# Patient Record
Sex: Female | Born: 1980 | Race: Black or African American | Hispanic: No | Marital: Married | Smoking: Never smoker
Health system: Southern US, Community
[De-identification: ages and names within clinical notes are randomized; demographics above are authoritative.]

## PROBLEM LIST (undated history)

## (undated) ENCOUNTER — Inpatient Hospital Stay (HOSPITAL_COMMUNITY): Payer: Self-pay

## (undated) DIAGNOSIS — I10 Essential (primary) hypertension: Secondary | ICD-10-CM

## (undated) DIAGNOSIS — O039 Complete or unspecified spontaneous abortion without complication: Secondary | ICD-10-CM

---
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
## 1998-06-05 ENCOUNTER — Emergency Department (HOSPITAL_COMMUNITY): Admission: EM | Admit: 1998-06-05 | Discharge: 1998-06-05 | Payer: Self-pay | Admitting: Emergency Medicine

## 1998-06-07 ENCOUNTER — Ambulatory Visit (HOSPITAL_COMMUNITY): Admission: RE | Admit: 1998-06-07 | Discharge: 1998-06-07 | Payer: Self-pay | Admitting: Emergency Medicine

## 1998-12-27 ENCOUNTER — Inpatient Hospital Stay (HOSPITAL_COMMUNITY): Admission: AD | Admit: 1998-12-27 | Discharge: 1998-12-27 | Payer: Self-pay | Admitting: *Deleted

## 1999-07-10 ENCOUNTER — Encounter: Payer: Self-pay | Admitting: *Deleted

## 1999-07-10 ENCOUNTER — Ambulatory Visit (HOSPITAL_COMMUNITY): Admission: AD | Admit: 1999-07-10 | Discharge: 1999-07-10 | Payer: Self-pay | Admitting: *Deleted

## 1999-07-10 ENCOUNTER — Encounter (INDEPENDENT_AMBULATORY_CARE_PROVIDER_SITE_OTHER): Payer: Self-pay

## 1999-07-12 ENCOUNTER — Inpatient Hospital Stay (HOSPITAL_COMMUNITY): Admission: AD | Admit: 1999-07-12 | Discharge: 1999-07-12 | Payer: Self-pay | Admitting: *Deleted

## 1999-07-15 ENCOUNTER — Inpatient Hospital Stay (HOSPITAL_COMMUNITY): Admission: AD | Admit: 1999-07-15 | Discharge: 1999-07-15 | Payer: Self-pay | Admitting: *Deleted

## 1999-07-16 ENCOUNTER — Encounter (INDEPENDENT_AMBULATORY_CARE_PROVIDER_SITE_OTHER): Payer: Self-pay

## 1999-07-16 ENCOUNTER — Ambulatory Visit (HOSPITAL_COMMUNITY): Admission: AD | Admit: 1999-07-16 | Discharge: 1999-07-16 | Payer: Self-pay | Admitting: Obstetrics and Gynecology

## 1999-10-08 ENCOUNTER — Ambulatory Visit (HOSPITAL_COMMUNITY): Admission: RE | Admit: 1999-10-08 | Discharge: 1999-10-08 | Payer: Self-pay | Admitting: Obstetrics and Gynecology

## 1999-10-08 ENCOUNTER — Encounter: Payer: Self-pay | Admitting: Obstetrics and Gynecology

## 1999-12-30 ENCOUNTER — Other Ambulatory Visit: Admission: RE | Admit: 1999-12-30 | Discharge: 1999-12-30 | Payer: Self-pay | Admitting: Obstetrics and Gynecology

## 2000-10-11 ENCOUNTER — Other Ambulatory Visit: Admission: RE | Admit: 2000-10-11 | Discharge: 2000-10-11 | Payer: Self-pay | Admitting: Obstetrics and Gynecology

## 2000-10-19 ENCOUNTER — Encounter (INDEPENDENT_AMBULATORY_CARE_PROVIDER_SITE_OTHER): Payer: Self-pay

## 2000-10-19 ENCOUNTER — Ambulatory Visit (HOSPITAL_COMMUNITY): Admission: RE | Admit: 2000-10-19 | Discharge: 2000-10-19 | Payer: Self-pay | Admitting: Obstetrics and Gynecology

## 2001-06-22 ENCOUNTER — Encounter: Payer: Self-pay | Admitting: Obstetrics and Gynecology

## 2001-06-22 ENCOUNTER — Other Ambulatory Visit: Admission: RE | Admit: 2001-06-22 | Discharge: 2001-06-22 | Payer: Self-pay | Admitting: Obstetrics and Gynecology

## 2001-06-22 ENCOUNTER — Ambulatory Visit (HOSPITAL_COMMUNITY): Admission: RE | Admit: 2001-06-22 | Discharge: 2001-06-22 | Payer: Self-pay | Admitting: Obstetrics and Gynecology

## 2001-06-26 ENCOUNTER — Encounter (INDEPENDENT_AMBULATORY_CARE_PROVIDER_SITE_OTHER): Payer: Self-pay | Admitting: Specialist

## 2001-06-26 ENCOUNTER — Ambulatory Visit (HOSPITAL_COMMUNITY): Admission: RE | Admit: 2001-06-26 | Discharge: 2001-06-26 | Payer: Self-pay | Admitting: Obstetrics and Gynecology

## 2001-06-28 ENCOUNTER — Encounter: Payer: Self-pay | Admitting: Obstetrics & Gynecology

## 2001-06-28 ENCOUNTER — Inpatient Hospital Stay (HOSPITAL_COMMUNITY): Admission: AD | Admit: 2001-06-28 | Discharge: 2001-06-28 | Payer: Self-pay | Admitting: Obstetrics & Gynecology

## 2002-07-18 ENCOUNTER — Encounter: Admission: RE | Admit: 2002-07-18 | Discharge: 2002-07-18 | Payer: Self-pay | Admitting: *Deleted

## 2002-07-25 ENCOUNTER — Ambulatory Visit (HOSPITAL_COMMUNITY): Admission: RE | Admit: 2002-07-25 | Discharge: 2002-07-25 | Payer: Self-pay | Admitting: *Deleted

## 2002-08-01 ENCOUNTER — Encounter: Admission: RE | Admit: 2002-08-01 | Discharge: 2002-08-01 | Payer: Self-pay | Admitting: *Deleted

## 2002-08-05 ENCOUNTER — Inpatient Hospital Stay (HOSPITAL_COMMUNITY): Admission: AD | Admit: 2002-08-05 | Discharge: 2002-08-05 | Payer: Self-pay | Admitting: *Deleted

## 2002-08-06 ENCOUNTER — Inpatient Hospital Stay (HOSPITAL_COMMUNITY): Admission: AD | Admit: 2002-08-06 | Discharge: 2002-08-06 | Payer: Self-pay | Admitting: Obstetrics and Gynecology

## 2002-08-09 ENCOUNTER — Encounter: Payer: Self-pay | Admitting: *Deleted

## 2002-08-09 ENCOUNTER — Ambulatory Visit (HOSPITAL_COMMUNITY): Admission: RE | Admit: 2002-08-09 | Discharge: 2002-08-09 | Payer: Self-pay | Admitting: *Deleted

## 2002-08-22 ENCOUNTER — Encounter: Admission: RE | Admit: 2002-08-22 | Discharge: 2002-08-22 | Payer: Self-pay | Admitting: *Deleted

## 2002-09-05 ENCOUNTER — Encounter: Admission: RE | Admit: 2002-09-05 | Discharge: 2002-09-05 | Payer: Self-pay | Admitting: *Deleted

## 2002-09-26 ENCOUNTER — Encounter: Payer: Self-pay | Admitting: *Deleted

## 2002-09-26 ENCOUNTER — Encounter: Admission: RE | Admit: 2002-09-26 | Discharge: 2002-09-26 | Payer: Self-pay | Admitting: *Deleted

## 2002-09-26 ENCOUNTER — Ambulatory Visit (HOSPITAL_COMMUNITY): Admission: RE | Admit: 2002-09-26 | Discharge: 2002-09-26 | Payer: Self-pay | Admitting: *Deleted

## 2002-10-07 ENCOUNTER — Inpatient Hospital Stay (HOSPITAL_COMMUNITY): Admission: AD | Admit: 2002-10-07 | Discharge: 2002-10-07 | Payer: Self-pay | Admitting: Family Medicine

## 2002-10-10 ENCOUNTER — Encounter: Admission: RE | Admit: 2002-10-10 | Discharge: 2002-10-10 | Payer: Self-pay | Admitting: Family Medicine

## 2002-10-24 ENCOUNTER — Encounter: Admission: RE | Admit: 2002-10-24 | Discharge: 2002-10-24 | Payer: Self-pay | Admitting: Family Medicine

## 2002-11-07 ENCOUNTER — Encounter: Admission: RE | Admit: 2002-11-07 | Discharge: 2002-11-07 | Payer: Self-pay | Admitting: Family Medicine

## 2002-11-11 ENCOUNTER — Encounter: Admission: RE | Admit: 2002-11-11 | Discharge: 2002-11-11 | Payer: Self-pay | Admitting: *Deleted

## 2002-11-11 ENCOUNTER — Encounter: Payer: Self-pay | Admitting: Obstetrics and Gynecology

## 2002-11-11 ENCOUNTER — Inpatient Hospital Stay (HOSPITAL_COMMUNITY): Admission: RE | Admit: 2002-11-11 | Discharge: 2002-11-11 | Payer: Self-pay | Admitting: *Deleted

## 2002-11-14 ENCOUNTER — Encounter: Admission: RE | Admit: 2002-11-14 | Discharge: 2002-11-14 | Payer: Self-pay | Admitting: Family Medicine

## 2002-11-18 ENCOUNTER — Encounter: Admission: RE | Admit: 2002-11-18 | Discharge: 2002-11-18 | Payer: Self-pay | Admitting: *Deleted

## 2002-11-21 ENCOUNTER — Encounter: Admission: RE | Admit: 2002-11-21 | Discharge: 2002-11-21 | Payer: Self-pay | Admitting: Family Medicine

## 2002-11-25 ENCOUNTER — Encounter: Admission: RE | Admit: 2002-11-25 | Discharge: 2002-11-25 | Payer: Self-pay | Admitting: *Deleted

## 2002-11-28 ENCOUNTER — Encounter: Admission: RE | Admit: 2002-11-28 | Discharge: 2002-11-28 | Payer: Self-pay | Admitting: *Deleted

## 2002-12-02 ENCOUNTER — Encounter: Admission: RE | Admit: 2002-12-02 | Discharge: 2002-12-02 | Payer: Self-pay | Admitting: *Deleted

## 2002-12-05 ENCOUNTER — Encounter: Admission: RE | Admit: 2002-12-05 | Discharge: 2002-12-05 | Payer: Self-pay | Admitting: *Deleted

## 2002-12-09 ENCOUNTER — Encounter: Admission: RE | Admit: 2002-12-09 | Discharge: 2002-12-09 | Payer: Self-pay | Admitting: *Deleted

## 2002-12-12 ENCOUNTER — Encounter: Admission: RE | Admit: 2002-12-12 | Discharge: 2002-12-12 | Payer: Self-pay | Admitting: *Deleted

## 2002-12-13 ENCOUNTER — Encounter (INDEPENDENT_AMBULATORY_CARE_PROVIDER_SITE_OTHER): Payer: Self-pay | Admitting: *Deleted

## 2002-12-13 ENCOUNTER — Inpatient Hospital Stay (HOSPITAL_COMMUNITY): Admission: RE | Admit: 2002-12-13 | Discharge: 2002-12-16 | Payer: Self-pay | Admitting: Obstetrics & Gynecology

## 2002-12-13 DIAGNOSIS — O321XX0 Maternal care for breech presentation, not applicable or unspecified: Secondary | ICD-10-CM

## 2002-12-18 ENCOUNTER — Inpatient Hospital Stay (HOSPITAL_COMMUNITY): Admission: AD | Admit: 2002-12-18 | Discharge: 2002-12-18 | Payer: Self-pay | Admitting: Obstetrics and Gynecology

## 2004-10-14 ENCOUNTER — Ambulatory Visit (HOSPITAL_COMMUNITY): Admission: AD | Admit: 2004-10-14 | Discharge: 2004-10-14 | Payer: Self-pay | Admitting: Obstetrics & Gynecology

## 2004-10-14 ENCOUNTER — Ambulatory Visit: Payer: Self-pay | Admitting: Obstetrics & Gynecology

## 2004-10-14 ENCOUNTER — Encounter (INDEPENDENT_AMBULATORY_CARE_PROVIDER_SITE_OTHER): Payer: Self-pay | Admitting: *Deleted

## 2004-10-28 ENCOUNTER — Ambulatory Visit: Payer: Self-pay | Admitting: Obstetrics and Gynecology

## 2004-12-23 ENCOUNTER — Ambulatory Visit: Payer: Self-pay | Admitting: Obstetrics and Gynecology

## 2005-03-20 ENCOUNTER — Inpatient Hospital Stay (HOSPITAL_COMMUNITY): Admission: AD | Admit: 2005-03-20 | Discharge: 2005-03-20 | Payer: Self-pay | Admitting: Family Medicine

## 2005-04-01 ENCOUNTER — Inpatient Hospital Stay (HOSPITAL_COMMUNITY): Admission: AD | Admit: 2005-04-01 | Discharge: 2005-04-01 | Payer: Self-pay | Admitting: Obstetrics & Gynecology

## 2005-04-28 ENCOUNTER — Other Ambulatory Visit: Admission: RE | Admit: 2005-04-28 | Discharge: 2005-04-28 | Payer: Self-pay | Admitting: Obstetrics and Gynecology

## 2005-05-20 ENCOUNTER — Inpatient Hospital Stay (HOSPITAL_COMMUNITY): Admission: AD | Admit: 2005-05-20 | Discharge: 2005-05-20 | Payer: Self-pay | Admitting: Obstetrics and Gynecology

## 2005-06-16 ENCOUNTER — Ambulatory Visit: Payer: Self-pay | Admitting: Obstetrics & Gynecology

## 2005-06-16 ENCOUNTER — Inpatient Hospital Stay (HOSPITAL_COMMUNITY): Admission: AD | Admit: 2005-06-16 | Discharge: 2005-06-18 | Payer: Self-pay | Admitting: Obstetrics and Gynecology

## 2005-08-16 ENCOUNTER — Inpatient Hospital Stay (HOSPITAL_COMMUNITY): Admission: AD | Admit: 2005-08-16 | Discharge: 2005-08-22 | Payer: Self-pay | Admitting: *Deleted

## 2005-08-16 ENCOUNTER — Ambulatory Visit: Payer: Self-pay | Admitting: *Deleted

## 2005-08-18 ENCOUNTER — Ambulatory Visit: Payer: Self-pay | Admitting: Neonatology

## 2005-08-18 ENCOUNTER — Encounter (INDEPENDENT_AMBULATORY_CARE_PROVIDER_SITE_OTHER): Payer: Self-pay | Admitting: Specialist

## 2005-08-18 DIAGNOSIS — O1413 Severe pre-eclampsia, third trimester: Secondary | ICD-10-CM

## 2005-08-18 DIAGNOSIS — O321XX0 Maternal care for breech presentation, not applicable or unspecified: Secondary | ICD-10-CM

## 2005-09-02 ENCOUNTER — Ambulatory Visit: Payer: Self-pay | Admitting: Gynecology

## 2005-10-19 ENCOUNTER — Encounter: Payer: Self-pay | Admitting: Emergency Medicine

## 2007-09-27 ENCOUNTER — Ambulatory Visit (HOSPITAL_COMMUNITY): Admission: RE | Admit: 2007-09-27 | Discharge: 2007-09-27 | Payer: Self-pay | Admitting: Family Medicine

## 2007-10-04 ENCOUNTER — Ambulatory Visit: Payer: Self-pay | Admitting: Family Medicine

## 2007-10-05 ENCOUNTER — Ambulatory Visit: Payer: Self-pay | Admitting: Gynecology

## 2007-10-08 ENCOUNTER — Ambulatory Visit (HOSPITAL_COMMUNITY): Admission: RE | Admit: 2007-10-08 | Discharge: 2007-10-08 | Payer: Self-pay | Admitting: Family Medicine

## 2007-10-25 ENCOUNTER — Ambulatory Visit: Payer: Self-pay | Admitting: Obstetrics & Gynecology

## 2007-10-25 ENCOUNTER — Inpatient Hospital Stay (HOSPITAL_COMMUNITY): Admission: RE | Admit: 2007-10-25 | Discharge: 2007-10-25 | Payer: Self-pay | Admitting: Obstetrics & Gynecology

## 2007-10-25 ENCOUNTER — Ambulatory Visit: Payer: Self-pay | Admitting: Physician Assistant

## 2007-10-29 ENCOUNTER — Ambulatory Visit (HOSPITAL_COMMUNITY): Admission: RE | Admit: 2007-10-29 | Discharge: 2007-10-29 | Payer: Self-pay | Admitting: Family Medicine

## 2007-10-31 ENCOUNTER — Ambulatory Visit: Payer: Self-pay | Admitting: Obstetrics and Gynecology

## 2007-10-31 ENCOUNTER — Inpatient Hospital Stay (HOSPITAL_COMMUNITY): Admission: RE | Admit: 2007-10-31 | Discharge: 2007-11-02 | Payer: Self-pay | Admitting: Family Medicine

## 2007-10-31 ENCOUNTER — Encounter: Payer: Self-pay | Admitting: Family Medicine

## 2007-11-01 ENCOUNTER — Encounter: Payer: Self-pay | Admitting: Obstetrics and Gynecology

## 2009-09-25 ENCOUNTER — Inpatient Hospital Stay (HOSPITAL_COMMUNITY): Admission: AD | Admit: 2009-09-25 | Discharge: 2009-09-25 | Payer: Self-pay | Admitting: Family Medicine

## 2009-10-01 ENCOUNTER — Ambulatory Visit: Payer: Self-pay | Admitting: Family Medicine

## 2009-10-01 LAB — CONVERTED CEMR LAB
AntiThromb III Func: 97 % (ref 76–126)
Anticardiolipin IgA: 2 (ref <22)
Protein S Ag, Total: 83 % (ref 70–140)

## 2009-10-05 ENCOUNTER — Ambulatory Visit (HOSPITAL_COMMUNITY): Admission: RE | Admit: 2009-10-05 | Discharge: 2009-10-05 | Payer: Self-pay | Admitting: Family Medicine

## 2009-10-26 ENCOUNTER — Ambulatory Visit (HOSPITAL_COMMUNITY): Admission: RE | Admit: 2009-10-26 | Discharge: 2009-10-26 | Payer: Self-pay | Admitting: Family Medicine

## 2009-10-29 ENCOUNTER — Encounter: Payer: Self-pay | Admitting: Family Medicine

## 2009-10-29 ENCOUNTER — Ambulatory Visit: Payer: Self-pay | Admitting: Obstetrics & Gynecology

## 2009-11-05 ENCOUNTER — Ambulatory Visit: Payer: Self-pay | Admitting: Obstetrics & Gynecology

## 2009-11-05 ENCOUNTER — Encounter: Payer: Self-pay | Admitting: Family Medicine

## 2009-11-05 LAB — CONVERTED CEMR LAB
ALT: 27 units/L (ref 0–35)
CO2: 26 meq/L (ref 19–32)
Calcium: 9.5 mg/dL (ref 8.4–10.5)
Chloride: 102 meq/L (ref 96–112)
Creatinine, Ser: 0.69 mg/dL (ref 0.40–1.20)
Glucose, Bld: 106 mg/dL — ABNORMAL HIGH (ref 70–99)
HCT: 34.4 % — ABNORMAL LOW (ref 36.0–46.0)
Hemoglobin: 10.8 g/dL — ABNORMAL LOW (ref 12.0–15.0)
Platelets: 228 10*3/uL (ref 150–400)
RBC: 4.14 M/uL (ref 3.87–5.11)
Uric Acid, Serum: 3.2 mg/dL (ref 2.4–7.0)
WBC: 8.1 10*3/uL (ref 4.0–10.5)

## 2009-11-06 ENCOUNTER — Encounter: Payer: Self-pay | Admitting: Family Medicine

## 2009-11-06 LAB — CONVERTED CEMR LAB: Creatinine 24 HR UR: 1461 mg/24hr (ref 700–1800)

## 2009-11-13 ENCOUNTER — Inpatient Hospital Stay (HOSPITAL_COMMUNITY): Admission: AD | Admit: 2009-11-13 | Discharge: 2009-11-13 | Payer: Self-pay | Admitting: Obstetrics & Gynecology

## 2009-11-23 ENCOUNTER — Ambulatory Visit (HOSPITAL_COMMUNITY): Admission: RE | Admit: 2009-11-23 | Discharge: 2009-11-23 | Payer: Self-pay | Admitting: Obstetrics & Gynecology

## 2009-11-26 ENCOUNTER — Ambulatory Visit: Payer: Self-pay | Admitting: Obstetrics & Gynecology

## 2009-11-27 IMAGING — US US OB COMP LESS 14 WK
1 series · 14 of 25 positions shown · non-contrast
Comparison: none

OBSTETRICAL ULTRASOUND:
 This ultrasound exam was performed in the [HOSPITAL] Ultrasound Department.  The OB US report was generated in the AS system, and faxed to the ordering physician.  This report is also available in [REDACTED] PACS.

[Series 1: us ob comp less 14 wk · 0.28mm/px · 14 of 25 slices shown]
[im 1/25]
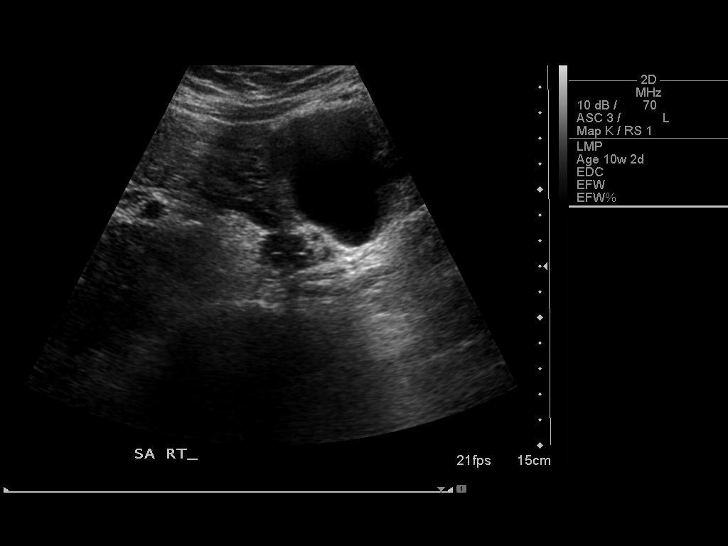
[im 3/25]
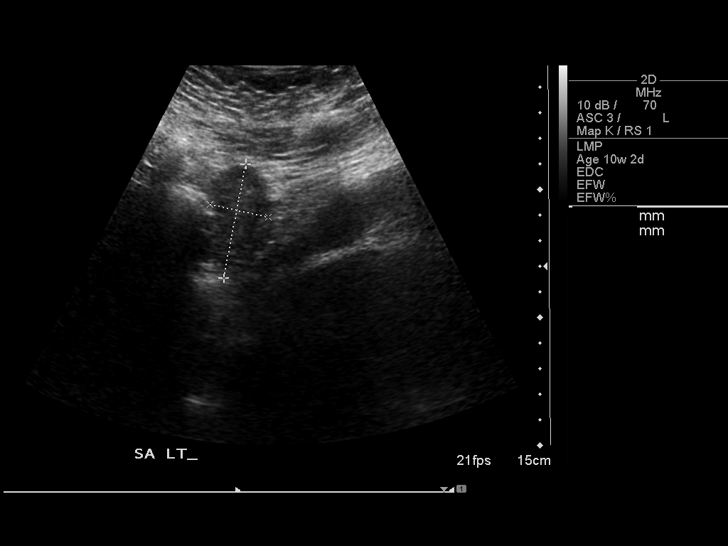
[im 5/25]
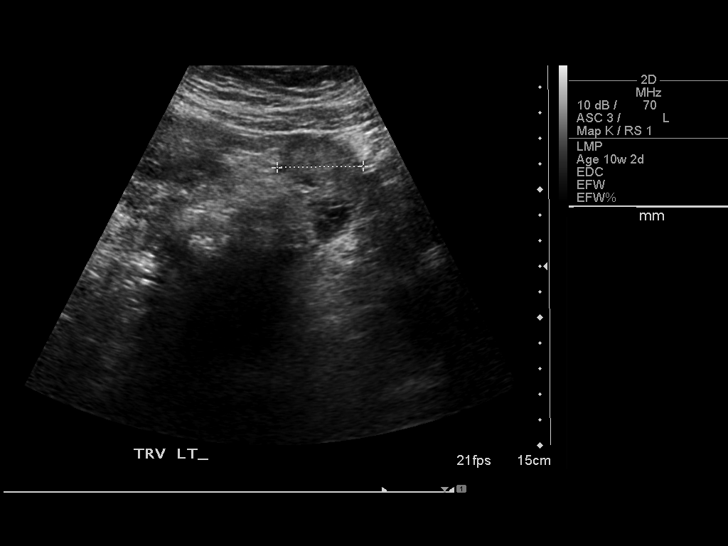
[im 7/25]
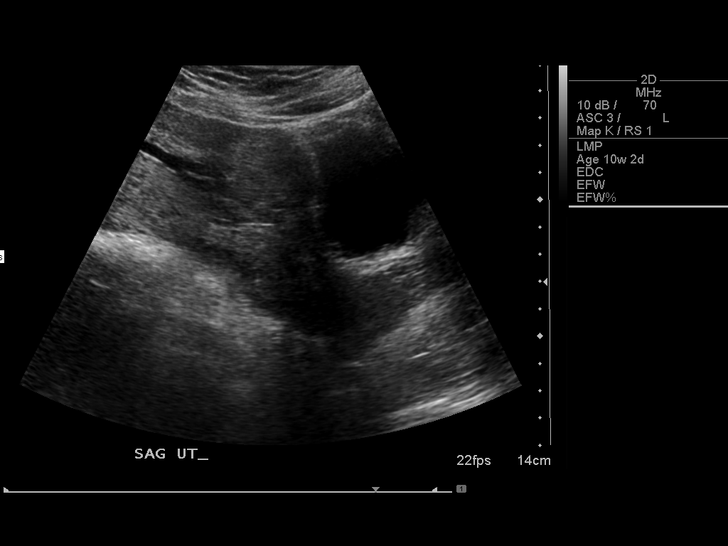
[im 9/25]
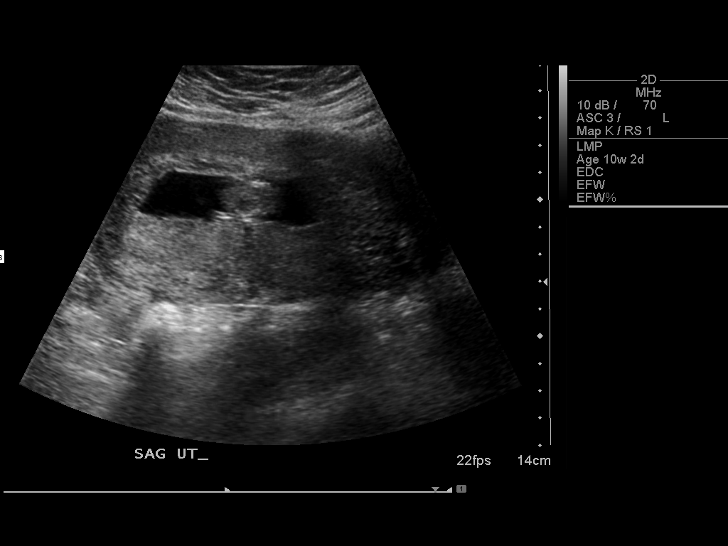
[im 10/25]
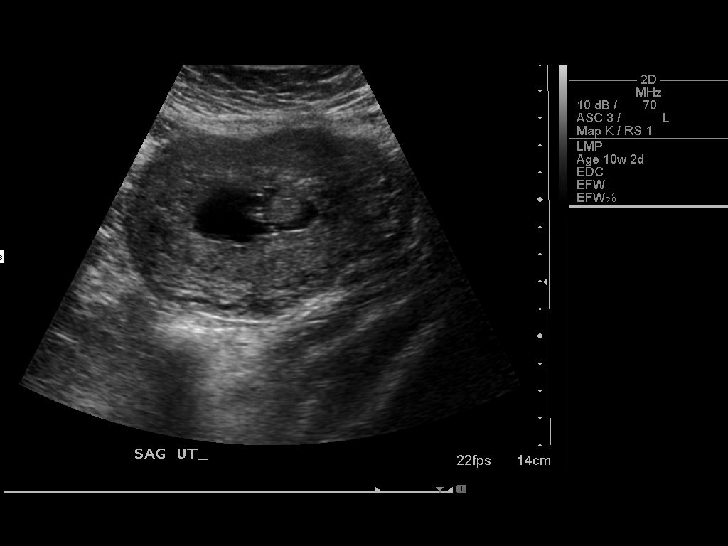
[im 12/25]
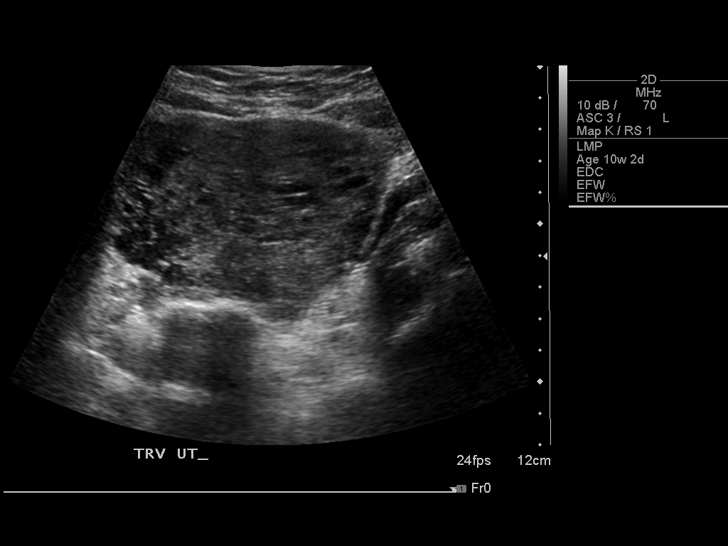
[im 14/25]
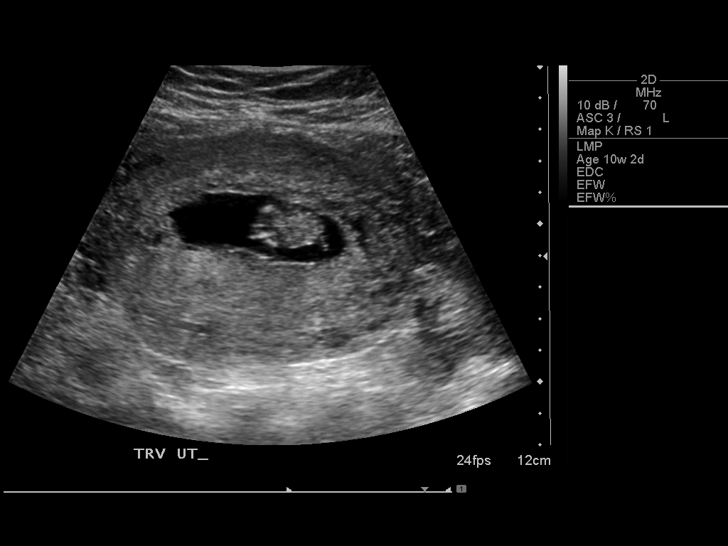
[im 16/25]
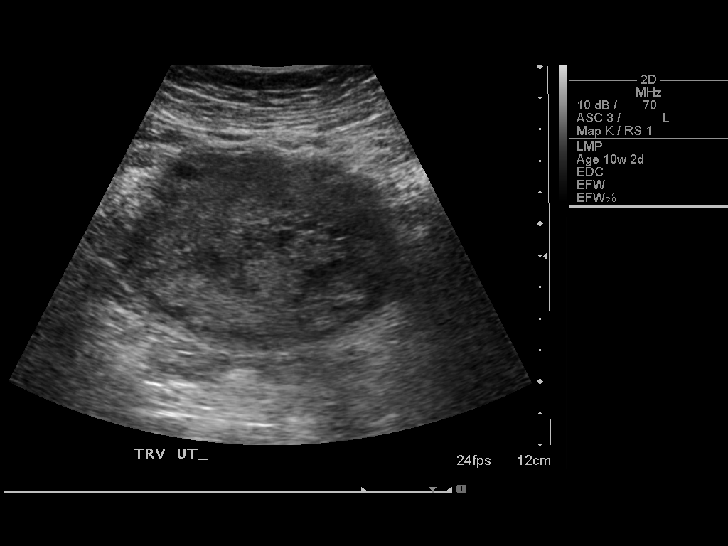
[im 17/25]
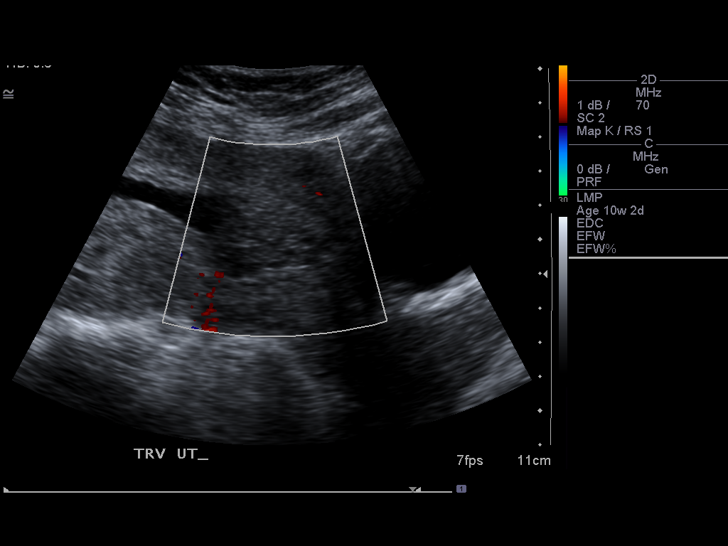
[im 19/25]
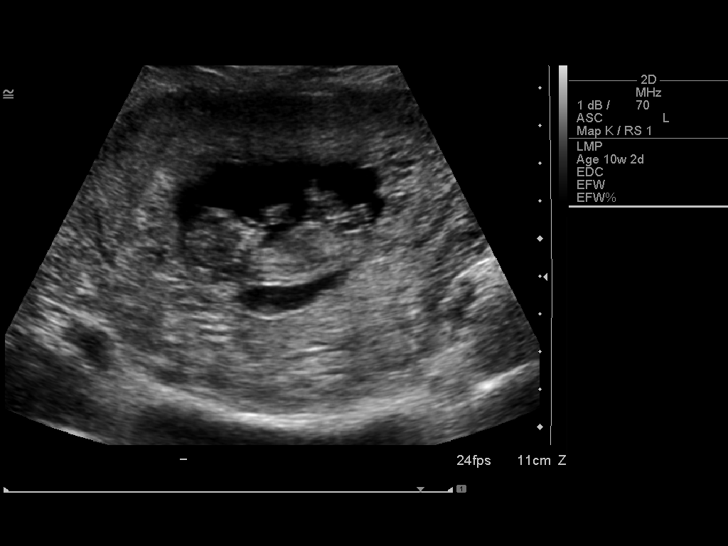
[im 21/25]
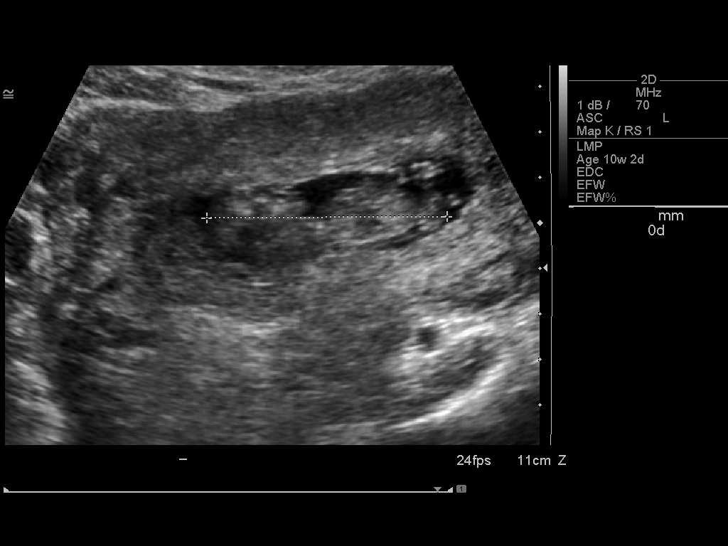
[im 23/25]
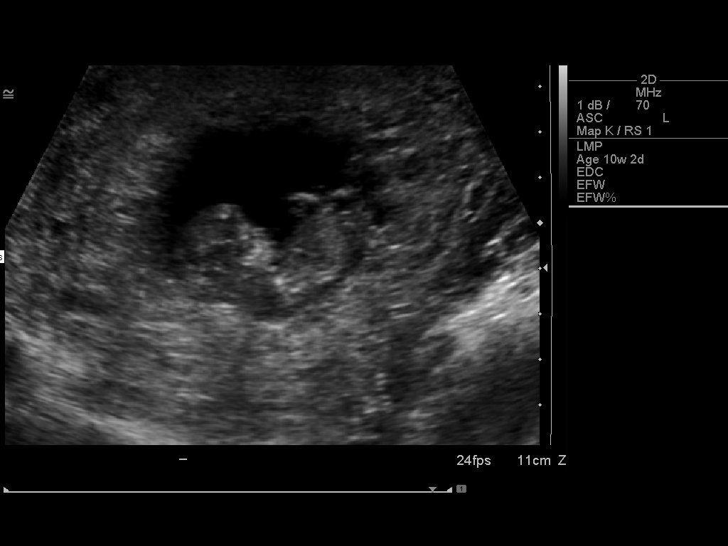
[im 25/25]
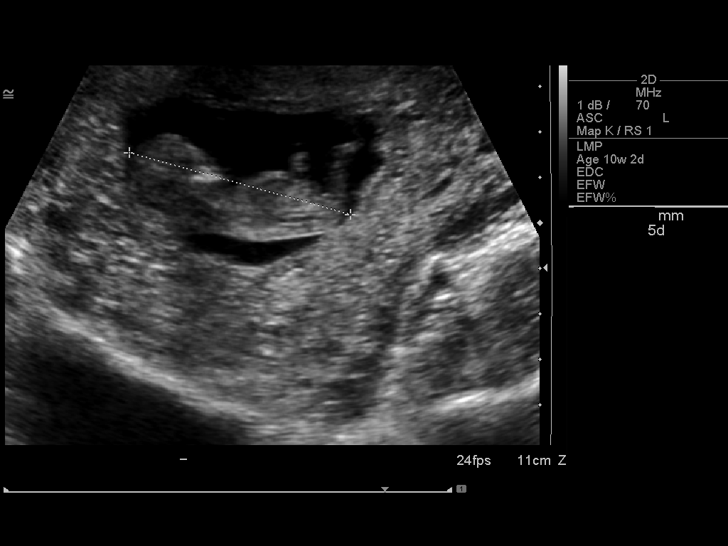

[14 of 25 positions shown; findings below may reference images not displayed]

IMPRESSION: See AS Obstetric US report.

## 2009-12-10 ENCOUNTER — Ambulatory Visit: Payer: Self-pay | Admitting: Obstetrics & Gynecology

## 2009-12-24 ENCOUNTER — Ambulatory Visit: Payer: Self-pay | Admitting: Obstetrics and Gynecology

## 2010-01-07 ENCOUNTER — Ambulatory Visit: Payer: Self-pay | Admitting: Obstetrics & Gynecology

## 2010-01-07 ENCOUNTER — Inpatient Hospital Stay (HOSPITAL_COMMUNITY): Admission: RE | Admit: 2010-01-07 | Discharge: 2010-01-11 | Payer: Self-pay | Admitting: Family Medicine

## 2010-01-07 ENCOUNTER — Ambulatory Visit: Payer: Self-pay | Admitting: Family Medicine

## 2010-01-07 LAB — CONVERTED CEMR LAB
Alkaline Phosphatase: 82 units/L (ref 39–117)
BUN: 4 mg/dL — ABNORMAL LOW (ref 6–23)
Glucose, Bld: 85 mg/dL (ref 70–99)
Hemoglobin: 11.4 g/dL — ABNORMAL LOW (ref 12.0–15.0)
MCHC: 31.5 g/dL (ref 30.0–36.0)
MCV: 85.6 fL (ref 78.0–100.0)
RBC: 4.23 M/uL (ref 3.87–5.11)
Sodium: 137 meq/L (ref 135–145)
Total Bilirubin: 0.3 mg/dL (ref 0.3–1.2)

## 2010-01-08 ENCOUNTER — Encounter: Payer: Self-pay | Admitting: Obstetrics & Gynecology

## 2010-01-14 ENCOUNTER — Ambulatory Visit: Payer: Self-pay | Admitting: Obstetrics and Gynecology

## 2010-01-15 ENCOUNTER — Inpatient Hospital Stay (HOSPITAL_COMMUNITY): Admission: AD | Admit: 2010-01-15 | Discharge: 2010-01-15 | Payer: Self-pay | Admitting: Obstetrics & Gynecology

## 2010-01-15 ENCOUNTER — Encounter: Payer: Self-pay | Admitting: Obstetrics & Gynecology

## 2010-01-15 ENCOUNTER — Ambulatory Visit: Payer: Self-pay | Admitting: Obstetrics and Gynecology

## 2010-02-10 ENCOUNTER — Ambulatory Visit: Payer: Self-pay | Admitting: Obstetrics and Gynecology

## 2010-03-05 ENCOUNTER — Ambulatory Visit: Payer: Self-pay | Admitting: Obstetrics & Gynecology

## 2010-07-11 ENCOUNTER — Encounter: Payer: Self-pay | Admitting: *Deleted

## 2010-07-11 ENCOUNTER — Encounter: Payer: Self-pay | Admitting: Family Medicine

## 2010-09-04 LAB — CBC
HCT: 33.7 % — ABNORMAL LOW (ref 36.0–46.0)
MCHC: 33.3 g/dL (ref 30.0–36.0)
MCV: 83.8 fL (ref 78.0–100.0)
RDW: 14.6 % (ref 11.5–15.5)
WBC: 8 10*3/uL (ref 4.0–10.5)

## 2010-09-04 LAB — COMPREHENSIVE METABOLIC PANEL
ALT: 29 U/L (ref 0–35)
AST: 30 U/L (ref 0–37)
Albumin: 2.9 g/dL — ABNORMAL LOW (ref 3.5–5.2)
CO2: 26 mEq/L (ref 19–32)
Calcium: 9.2 mg/dL (ref 8.4–10.5)
Chloride: 103 mEq/L (ref 96–112)
Creatinine, Ser: 0.51 mg/dL (ref 0.4–1.2)
GFR calc Af Amer: >60 mL/min (ref >60)
Sodium: 134 mEq/L — ABNORMAL LOW (ref 135–145)
Total Bilirubin: 0.3 mg/dL (ref 0.3–1.2)

## 2010-09-04 LAB — CREATININE CLEARANCE, URINE, 24 HOUR
Creatinine Clearance: 249 mL/min — ABNORMAL HIGH (ref 75–115)
Creatinine, Urine: 66.6 mg/dL
Creatinine: 0.51 mg/dL (ref 0.4–1.2)

## 2010-09-04 LAB — POCT URINALYSIS DIP (DEVICE)
Bilirubin Urine: NEGATIVE
Ketones, ur: NEGATIVE mg/dL
Specific Gravity, Urine: 1.02 (ref 1.005–1.030)
Specific Gravity, Urine: 1.025 (ref 1.005–1.030)
pH: 6.5 (ref 5.0–8.0)

## 2010-09-04 LAB — PROTEIN, URINE, 24 HOUR: Urine Total Volume-UPROT: 2750 mL

## 2010-09-04 LAB — STREP B DNA PROBE: Strep Group B Ag: POSITIVE

## 2010-09-05 LAB — POCT URINALYSIS DIP (DEVICE)
Glucose, UA: NEGATIVE mg/dL
Ketones, ur: NEGATIVE mg/dL
Specific Gravity, Urine: 1.025 (ref 1.005–1.030)

## 2010-09-06 LAB — POCT URINALYSIS DIP (DEVICE)
Bilirubin Urine: NEGATIVE
Bilirubin Urine: NEGATIVE
Glucose, UA: NEGATIVE mg/dL
Ketones, ur: NEGATIVE mg/dL
Ketones, ur: NEGATIVE mg/dL
Ketones, ur: NEGATIVE mg/dL
Nitrite: NEGATIVE
Protein, ur: NEGATIVE mg/dL
Specific Gravity, Urine: 1.02 (ref 1.005–1.030)
Specific Gravity, Urine: 1.02 (ref 1.005–1.030)
Specific Gravity, Urine: 1.025 (ref 1.005–1.030)
pH: 7 (ref 5.0–8.0)

## 2010-09-06 LAB — URINALYSIS, ROUTINE W REFLEX MICROSCOPIC
Glucose, UA: NEGATIVE mg/dL
Ketones, ur: NEGATIVE mg/dL
Leukocytes, UA: NEGATIVE
pH: 7 (ref 5.0–8.0)

## 2010-09-06 LAB — URINE MICROSCOPIC-ADD ON

## 2010-09-06 LAB — URINE CULTURE: Colony Count: >=100000

## 2010-09-07 LAB — POCT URINALYSIS DIP (DEVICE)
Bilirubin Urine: NEGATIVE
Ketones, ur: NEGATIVE mg/dL
pH: 6 (ref 5.0–8.0)

## 2010-09-08 LAB — WET PREP, GENITAL
Trich, Wet Prep: NONE SEEN
Yeast Wet Prep HPF POC: NONE SEEN

## 2010-09-08 LAB — POCT URINALYSIS DIP (DEVICE)
Glucose, UA: NEGATIVE mg/dL
Nitrite: NEGATIVE
Urobilinogen, UA: 0.2 mg/dL (ref 0.0–1.0)

## 2010-11-02 NOTE — Op Note (Signed)
NAMEOVIE, EASTEP          ACCOUNT NO.:  [id number]   MEDICAL RECORD NO.:  [id number]          PATIENT TYPE:  INP   LOCATION:                                [facility]ILITY:  [facility]   PHYSICIAN:  Tilda Burrow, M.D. DATE OF BIRTH:  [birth year]   DATE OF PROCEDURE:  10/31/2007  DATE OF DISCHARGE:                               OPERATIVE REPORT   PREOPERATIVE DIAGNOSIS:  Retained placenta, status post spontaneous  abortion, 16 weeks.   POSTOPERATIVE DIAGNOSIS:  Retained placenta, status post spontaneous  abortion, 16 weeks.   PROCEDURE:  Dilation and extraction for retained placenta.   SURGEON:  Tilda Burrow, MD   ASSISTANT:  None.   ANESTHESIA:  General with endotracheal intubation.   COMPLICATIONS:  None.   FINDINGS:  Uterus sounding to 16 cm preprocedure, 11 cm postprocedure.  Generous tissue fragments consistent with placental tissue.  Blood type  A positive.   INDICATIONS:  A 30 year old female who was induced for missed AB at 65  weeks' gestation, who expelled the fetus at 1:30 a.m. this morning, and  was given additional 4 hours of IV oxytocin, unable to expel the fetus,  OR time available at noon, suction and uterine dilation and extraction  performed.   DETAILS OF PROCEDURE:  The patient was taken to the operating room,  prepped and draped in usual fashion for vaginal procedure with high  lithotomy leg support.  Time-out was performed and confirmed.  IV  antibiotics were administered.  The patient had cervix, which can be  grasped, was already open 2-cm, allowed easy introduction of a 35-mm  uterine sound to a depth of approximately 16 cm.  The uterus was probed  with a 4-cm long grasping forceps, identifying large tissue fragments  consistent with placental tissue, which we were able peel off the  uterus.  The placental detachment was on the right posterior portion of  the uterine fundus.  Multiple large fragments of placental tissue were  extracted and then a  banjo curette was used to gently curette the  surfaces.  The anterior surface was smooth and without any remnants and  posteriorly, there was initial tissue present.  Additional extraction  with grasping with the large extraction forceps, removed additional  tissue fragments.  The uterus could be felt to be shrinking in size  throughout this portion of the procedure.  At the end of the second  attempt, smooth/sharp curettage posteriorly, obtained a uniform smooth  feel consistent with adequate evacuation.  The procedure was considered  completed.  Bimanual exam showed a markedly smaller uterus with good  tone.  IV oxytocin was infused throughout  the procedure.  The EBL for the procedure itself was about 500 mL.  Blood type is confirmed as A positive in the chart.  The patient will be  given fluid infusion for at least 4 hours, and be considered for  discharge this p.m. or in a.m. depending on the clinical status this  afternoon.      Tilda Burrow, M.D.  Electronically Signed     JVF/MEDQ  D:  11/01/2007  T:  11/02/2007  Job:  [id number]

## 2010-11-02 NOTE — Discharge Summary (Signed)
Shelley Young, Shelley Young          ACCOUNT NO.:  [id number]   MEDICAL RECORD NO.:  [id number]          PATIENT TYPE:  INP   LOCATION:  9315                          [facility]ILITY:  [facility]   PHYSICIAN:  Phil D. Okey Dupre, M.D.     DATE OF BIRTH:  Sep 30, 1980   DATE OF ADMISSION:  10/31/2007  DATE OF DISCHARGE:  11/02/2007                               DISCHARGE SUMMARY   ADMISSION DIAGNOSES:  1. Intrauterine fetal demise at 16 weeks 4 days.  2. History of the 5 previous spontaneous abortions.  3. History of previous cesarean section x2.   DISCHARGE DIAGNOSES:  1. Postpartum day #1 from spontaneous vaginal delivery of a nonviable      infant.  2. Postoperative day #1 from an dilatation and curettage of retained      placenta.  3. Anemia.   PROCEDURES:  1. The patient had ultrasound performed prior to admission showing a      single gestation with no fetal cardiac activity.  Oligohydramnios      was noted and the gestational age was 16 weeks 4 days determined by      a previous ultrasound.  2. The patient had repeat ultrasound on Nov 01, 2007, showing retained      products of conception.  3. The patient had a dilatation and curettage performed by Dr. Christin Bach on Nov 01, 2007, for retained placenta after delivery of a      16-week nonviable infant.   COMPLICATIONS:  The patient did have retained placenta after delivery of  a nonviable infant, which required dilatation and curettage to eliminate  the placenta.   CONSULTATIONS:  None.   PERTINENT LABORATORY FINDINGS:  On admission, her white blood cell count  was 6.6, hemoglobin 11.8, hematocrit 34.8, and platelets 246.  Followup  CBC on postpartum day #1 showed white blood cell count 8.4, hemoglobin  10.9, hematocrit 32.3, and platelets 230.  Type and screen showed blood  type A+, antibody screen negative.  Postprocedure, CBC showed white  blood cell count 10.9, hemoglobin 8.3, hematocrit 24.6, and platelets  213.   BRIEF  PERTINENT ADMISSION HISTORY:  Ms. Aguinaga is a gravida 8,  para 0-2-5-2 presenting with ultrasound showing nonviable infant.  She  was to be admitted for induction of labor secondary to the second  trimester loss.   HOSPITAL COURSE:  The patient was admitted and placed in the antenatal  intensive care unit.  Her induction was begun with Cytotec and Pitocin  was later added.  She did progress to delivery of a nonviable fetus at  1:00 a.m. on Nov 01, 2007.  The cord was clamped at the introitus and  Pitocin was given to help with delivery of the placenta.  After several  hours, the patient was reassessed and placenta was still retained.  It  was decided she needed to be taken for dilatation and curettage for  retained placenta.  She was taken on Nov 01, 2007, and had dilatation  and curettage of the placenta.  She was stable postoperatively and was  ready for discharge on postoperative day #  1.  Her vitals were stable  throughout her stay.  Physical examination was normal.  Clear lungs.  Abdomen was mildly tender.  Fundus was firm.  Her bleeding had lightened  considerably.  She was stable for discharge.  Hemoglobin was 8.3, down  from 11.8 at admission, but she denied dizziness or lightheadedness.   DISCHARGE MEDICATIONS:  1. Ferrous sulfate 325 mg 1 p.o. b.i.d. for 30 days.  2. Colace 100 mg 1 p.o. b.i.d.  3. Vicodin 5/500 mg 1 p.o. every 4 hours as needed for pain.  4. Motrin 600 mg 1 p.o. every 6 hours as needed for pain.  5. Sprintec contraceptive pill 1 tablet p.o. daily as directed.   DISCHARGE INSTRUCTIONS:  1. Discharge to home.  2. Regular diet.  3. No sexual activity for 6 weeks.  4. The patient is to follow with the home department for postpartum      examination in 6 weeks.      Karlton Lemon, MD  Electronically Signed     ______________________________  Javier Glazier. Okey Dupre, M.D.    NS/MEDQ  D:  11/02/2007  T:  11/02/2007  Job:  [id number]

## 2010-11-05 NOTE — Op Note (Signed)
[facility]  Patient:    Shelley [name], Shelley [name]                 MRN: [id number] Proc. Date: 10/19/00 Adm. Date:  [id number] Attending:  Dierdre Forth Pearline                           Operative Report  PREOPERATIVE DIAGNOSES:       1. Missed abortion.                               2. Habitual abortion.  POSTOPERATIVE DIAGNOSES:      1. Missed abortion.                               2. Habitual abortion.  OPERATION:                    Suction dilatation and evacuation.  SURGEON:                      Vanessa P. Pennie Rushing, M.D.  ANESTHESIA:                   Monitored anesthesia care which was                               subsequently taken to general.  COMPLICATIONS:                Intraoperative coughing requiring intubation.  ESTIMATED BLOOD LOSS:         Less than 25 cc.  FINDINGS:                     The uterus was enlarged to approximately 9 weeks size. A moderate amount of products of conception were obtained at D&E.   DESCRIPTION OF PROCEDURE:     The patient was taken to the operating room after appropriate identification and placed on the operating table. After placement of ______ monitored anesthesia care, she was placed in the lithotomy position. The perineum and vagina were prepped with multiple layers of Betadine and a red Robinson catheter used to empty the bladder. The perineum was draped as a sterile field. A Graves speculum was placed in the vagina and a single-tooth tenaculum placed on the anterior cervix. A paracervical block was achieved with a total of 10 cc of 2% Xylocaine. The cervix was dilated to accommodate a #9 suction catheter. The suction catheter was used to suction curet all products of conception from the uterus. Hemostasis was then noted to be adequate, and the patient was awakened from general anesthesia and taken to the recovery room in satisfactory condition having tolerated the procedure well with sponge  and instrument counts correct.  DD:  10/19/00 TD:  10/20/00 Job: [id number]

## 2010-11-05 NOTE — Group Therapy Note (Signed)
NAME:  Shelley [name], Shelley [name] NO.:  [id number]   MEDICAL RECORD NO.:  [id number]          PATIENT TYPE:  WOC   LOCATION:  [facility]                   FACILITY:  [facility]   PHYSICIAN:  Argentina Donovan, MD        DATE OF BIRTH:  [birth year]   DATE OF SERVICE:                                    CLINIC NOTE   HISTORY OF PRESENT ILLNESS:  The patient is a 30 year old black, [place]-  African female from the [place] who is in 2 weeks following a D&C for  incomplete abortion.  She is a gravida 3, para 2-0-1-2 who has had one  cesarean section for a breech, and desires pregnancy again.  Her main  complaint is she is still having pain and bleeding since the D&C.   PHYSICAL EXAMINATION:  PELVIC:  The uterus is irregular in configuration,  approximately 8-10 weeks' gestational size, and we have discussed this with  the patient who was unaware that she had fibroid tumors.   MEDICAL DECISION MAKING:  I told her that they are probably still  involuting, and we will get an ultrasound when she comes back in 2 months to  see me and then we will be able to advise her better on the probability of  success with a subsequent pregnancy.   IMPRESSION:  1.  Status post dilatation and curettage.  2.  Leiomyomata uteri.      PR/MEDQ  D:  10/28/2004  T:  10/28/2004  Job:  [id number]

## 2010-11-05 NOTE — Op Note (Signed)
NAMELANDRI, DORSAINVIL          ACCOUNT NO.:  [id number]   MEDICAL RECORD NO.:  [id number]          PATIENT TYPE:  INP   LOCATION:  9373                          [facility]ILITY:  [facility]   PHYSICIAN:  Lesly Dukes, M.D. DATE OF BIRTH:  Feb 24, 1981   DATE OF PROCEDURE:  DATE OF DISCHARGE:                                 OPERATIVE REPORT   PREOPERATIVE DIAGNOSIS:  1.  Intrauterine pregnancy at 27-2/7 weeks.  2.  Severe preeclampsia.  3.  Previous cesarean section.  4.  Breech presentation.   POSTOPERATIVE DIAGNOSES:  1.  Intrauterine pregnancy at 27-2/7 weeks.  2.  Severe preeclampsia.  3.  Previous cesarean section.  4.  Breech presentation.   PROCEDURE:  Repeat low transverse cesarean section via Pfannenstiel.   SURGEON:  Lesly Dukes, M.D.   ASSISTANTKaroline Caldwell B. Merlene Morse, M.D.   ANESTHESIA:  Spinal.   COMPLICATIONS:  None.   ESTIMATED BLOOD LOSS:  600 mL.   FLUIDS:  1600 mL.   URINE OUTPUT:  200 mL of clear urine at the end of procedure.   INDICATIONS:  A 30 year old G6, P1-0-4-1, with severe preeclampsia with  IUGR, breech presentation and previous C-section.   FINDINGS:  A female infant in footling breech presentation.  No meconium.  Apgars 7 and 8.  Normal uterus, tubes, and ovaries.  Arterial pH 7.30.  Venous pH 7.33.   PROCEDURE:  The patient was taken to the operating room, where spinal  anesthesia was obtained and found to be adequate.  She was then prepped and  draped in the normal sterile fashion and placed in the supine position with  a leftward tilt.  A Pfannenstiel skin incision was then made with a scalpel  and carried through to the underlying layers of fascia with the Bovie.  The  fascia was then incised with a knife and the incision extended laterally  with the Mayo scissors.  The superior aspect of the fascial incision was  then grasped with the Kocher clamps, elevated and the underlying rectus  muscles dissected off bluntly.  Attention was then  turned to the inferior  aspect of the incision, which was in a similar fashion grasped with a Kocher  clamp and the rectus muscles dissected off bluntly.  The rectus muscle was  then separated in the midline and the peritoneum identified, tented up and  entered sharply with the Metzenbaum scissors.  The peritoneal incision was  then extended superiorly and inferiorly with good visualization of the  bladder.  A bladder blade was then inserted and the vesicouterine peritoneum  identified, grasped with pick-ups and entered sharply with the Metzenbaum  scissors.  The incision was then extended laterally and the bladder flap  created digitally.  The bladder blade was then reinserted and then the lower  uterine segment was incised in a transverse fashion with a scalpel.  The  uterine incision was then extended laterally with the bandage scissors.  The  bladder blade was removed and the infant was delivered with breech  extraction.  The cord was clamped and cut and the infant handed off to the  waiting NICU  team.  Cord gases were sent.  The placenta was then removed  manually, the uterus exteriorized, cleared of all clots and debris.  The  uterine incision was repaired in a running locked fashion.  The gutters were  cleared of all clots.  The fascia was reapproximated in a running fashion.  The skin was closed with staples.  A small mass was found on the abdomen,  which was felt to be a piece of the placenta, and it was sent for pathology.  The patient tolerated the procedure well.  Sponge, lap and needle count were  correct x2.  Ancef 2 g was given at cord clamp.  The patient was taken to  the recovery room in stable condition.     ______________________________  August Saucer Merlene Morse, MD    ______________________________  Lesly Dukes, M.D.    ABC/MEDQ  D:  08/18/2005  T:  08/19/2005  Job:  [id number]

## 2010-11-05 NOTE — Op Note (Signed)
[facility]  Patient:    Shelley [name], Shelley [name] Visit Number: [id number] MRN: [id number]          Service Type: DSU Location: [facility] Attending Physician:  Miguel Aschoff Dictated by:   Miguel Aschoff, M.D. Proc. Date: 06/26/01 Admit Date:  06/25/2001                             Operative Report  PREOPERATIVE DIAGNOSES:       1. Intrauterine fetal demise at 11 weeks.                               2. Habitual abortion.  POSTOPERATIVE DIAGNOSES:      1. Intrauterine fetal demise at 11 weeks.                               2. Habitual abortion.  PROCEDURE:                    Suction dilatation and evacuation.  SURGEON:                      Miguel Aschoff, M.D.  ANESTHESIA:                   IV sedation with paracervical block.  COMPLICATIONS:                None.  JUSTIFICATION:                The patient is a 30 year old black female gravida 3, para 0-0-2-0 with two prior spontaneous fetal deaths at approximately 8-10 weeks.  She is now pregnant for the third time at 11 weeks with an intrauterine fetal demise.  She presents now to undergo evacuation of the uterus and to undergo genetic testing to see if an etiology for these recurrent losses can be identified and corrected.  Risks and benefits of the procedure were discussed with the patient and her husband.  PROCEDURE:                    Patient was taken to the operating room, placed in a supine position, and IV sedation was administered without difficulty. She was then placed in a dorsal lithotomy position, prepped and draped in a usual sterile fashion.  After this was done examination revealed the uterus to be anterior, approximately 10-11 weeks in size.  The speculum was placed in the vaginal vault.  The cervix was injected with 18 cc of 1% Xylocaine by placing 6 cc at the 12, 4, and 8 oclock positions.  After this was done the endocervical canal was dilated using 0 Pratt dilators and using a #10  vacuum curette the contents of the uterus were evacuated without difficulty. Following this sharp curettage was carried out yielding only a small amount of additional tissue.  When this was completed instruments were removed. Hemostasis was readily achieved.  Patient was taken out of lithotomy position and brought to the recovery room in satisfactory condition.  PLAN:                         Patient to be discharged home.  DISCHARGE MEDICATIONS:        1. Darvocet-N 100 one q.4h. p.r.n. for pain.  2. Doxycycline 100 mg b.i.d. x3 days.  SPECIMEN:                     Portions of the products of conception were sent for genetic testing and karyotyping.  Patient is also to be tested for lupus anticoagulant and anticardiolipin antibody.  FOLLOWUP:                     She will be seen back in four weeks for follow-up examination.  She understands to call if there are any problems such as fever, pain, or heavy bleeding. Dictated by:   Miguel Aschoff, M.D. Attending Physician:  Miguel Aschoff DD:  06/26/01 TD:  06/26/01 Job: [id number]

## 2010-11-05 NOTE — Discharge Summary (Signed)
   NAMEKAMYLAH, MANZO                    ACCOUNT NO.:  [id number]   MEDICAL RECORD NO.:  [id number]                   PATIENT TYPE:  INP   LOCATION:  9113                                 [facility]ILITY[facility]   PHYSICIAN:  Lorne Skeens, D.O.                   DATE OF BIRTH:  10-29-1980   DATE OF ADMISSION:  12/13/2002  DATE OF DISCHARGE:  12/16/2002                                 DISCHARGE SUMMARY   DISCHARGE DIAGNOSES:  1. Postoperative day #3 status post primary low transverse cesarean section     for breech presentation.  2. Rubella immune.  3. A positive, antibody negative.  4. Breast feeding.  5. Contraception, Micronor.   DISCHARGE MEDICATIONS:  1. Percocet 5/325 mg one to two tablets p.o. q.4h. p.r.n. pain #25.  2. Micronor one tablet p.o. daily at the same time.  3. Prenatal vitamins one tablet p.o. daily while breast feeding at least six     weeks.   HOSPITAL COURSE:  Ms. Bronk is a 30 year old female, G4, P1-0-3-1, who  presented to [facility] for primary low transverse cesarean section  secondary to breech presented, admitted from the high risk clinic.  She was  delivered at 39 weeks intrauterine pregnancy, uncomplicated C-section  delivery of a term female with Apgars 8 at one and 9 at five.  Cord pH was  7.365.  Female infant weight 6 pounds 13 ounces.  Epidural anesthesia.  Estimated blood loss was 1300 mL.   Postoperative postpartum course uneventful except for mild elevation and  temperature up to 100.0 degrees.  She did have quite amount of fundal  tenderness on postoperative day #2 which subsided by postoperative day #3.  Incision is clean, dry and intact and approximated by staples which are to  be removed postoperative day #5 ________.  She has decided to breast feed.  Female circumcision has been performed and she will be started on Micronor for  contraception.   FOLLOW UP:  She has a six-week follow-up appointment at [facility].   LABORATORY  DATA:  Hemoglobin postpartum 10.8.                                               Lorne Skeens, D.O.    KL/MEDQ  D:  12/16/2002  T:  12/16/2002  Job:  [id number]

## 2010-11-05 NOTE — Discharge Summary (Signed)
NAMEAUBRII, SHARPLESS          ACCOUNT NO.:  [id number]   MEDICAL RECORD NO.:  [id number]          PATIENT TYPE:  INP   LOCATION:  9303                          [facility]  [facility]   PHYSICIAN:  Whitney Post, M.D.  DATE OF BIRTH:  11/09/1980   DATE OF ADMISSION:  08/16/2005  DATE OF DISCHARGE:  08/22/2005                                 DISCHARGE SUMMARY   OPERATIONS AND PROCEDURES:  1.  Complete OB ultrasound on February 28, which showed IUGR with the baby      at the 5th to 10th percentile and no end diastolic flow.  2.  Repeat low-transverse C-section done August 18, 2005.  Please see      operative note for details.   DISCHARGE MEDICATIONS:  1.  Prenatal vitamins.  2.  Percocet 5/325, 1 tab q.6 p.r.n. pain.  3.  Ibuprofen 600 mg q.6 p.r.n. pain.  4.  Colace 1 tab p.o. b.i.d.  5.  Micronor 1 tab p.o. daily at the same time as directed on the packet.  6.  Hydrochlorothiazide 25 mg daily.   HOSPITAL COURSE:  The patient is a 29 year old G6, P1-0-4-1, who presented  at 43 weeks estimated gestational age for IUGR and no end diastolic flow as  seen on routine ultrasound.  At the time of presentation, she also was found  to have increased blood pressure, but no visual changes, headaches or right  upper quadrant pain.  She had been receiving her routine prenatal care at  [facility] through the [facility] program and had initiated her  prenatal care lab at 11 weeks.  Her pregnancy had been complicated by a  history of a prior C-section and a second trimester bleed, which had since  resolved.  At the time of admission, the patient was afebrile with a  temperature of 98.8, a pulse of 107 and a blood pressure of 159/99.  Her  heart was regular rate and rhythm.  Her lungs were clear to auscultation.  She had slight edema of her extremities and she had 2+ patellar reflexes  with no clonus.  The plan was to admit her to the antenatal unit and  initiate PIH workup, along with an  antiphospholipid syndrome workup.  Once  admitted to the antenatal unit, she received betamethasone x2 doses on both  February 27 and February 28.  She had labs, which showed a WBC of 8.6, a  hemoglobin of 13.2, a hematocrit of 40 and a platelet count of 249.  Her  LFTs were within normal limits.  Her LDH was 210.  Her uric acid was 4.4 and  a 24-hour urine collection was started.  She also  had APLA labs.  Her  cervical exam on admission showed that her external os was fingertip in  dilation, the internal os was closed, her cervix was long and high, her  fetal heart tones were in the 140s and she was contracting every 3-5  minutes.  On March 1, it was found that her 24-hour urine collection showed  a protein of 2445 mg and was positive, which was suspicious for collagen  vascular disease  that would increase her risk of preeclampsia.  After a team  discussion, it was decided after reviewing her labs, her fetal heart tones,  her ultrasound and the 24-hour urine results, that given the fact the baby  was less than the 10th percentile in growth and had absent end diastolic  flow, the patient qualified for the diagnoses of severe preeclampsia.  A C-  section was planned for the afternoon of August 18, 2005.  Please see the  operative note for details of the C-section.  Following delivery, the  patient was placed on magnesium secondary to her severe preeclampsia  diagnosis in order to prevent seizures.  She remained on her magnesium and  then the magnesium was discharged without problem.  Her postpartum course  was complicated by headache and visual changes on postpartum day 2, but the  patient __________ while symptomatic and was started on hydrochlorothiazide  once her symptoms resolved the following morning.  The rest of the patient's  postpartum course remained uncomplicated and she was discharged home on  postop day 4 following removal of her staples.  The patient was instructed  on the  importance of the need to establish care with a primary physician to  follow her __________ arrive at [facility] the day following discharge  with her discharge paperwork and any proof of income to determine  eligibility.  The patient was provided with directions and a phone number to  [facility].  In case the patient does not meet eligibility  requirements, she was also given the telephone numbers and names of other  clinics in the [place] area that serve people without insurance including  [facility] and the [facility].  If she does not receive care at  [facility], she was to call one of those numbers to make an appointment.  She will have her routine postpartum followup at [facility] in 6 weeks  and she will be discharged home on the medications as listed above.   Dr. Neena Rhymes dictating for Dr. Corky Sox.      Whitney Post, M.D.     KF/MEDQ  D:  08/22/2005  T:  08/22/2005  Job:  [id number]

## 2010-11-05 NOTE — Discharge Summary (Signed)
Shelley [name], Shelley [name]          ACCOUNT NO.:  [id number]   MEDICAL RECORD NO.:  [id number]          PATIENT TYPE:  INP   LOCATION:  9319                          [facility]   PHYSICIAN:  Lesly Dukes, M.D. DATE OF BIRTH:  Mar 18, 1981   DATE OF ADMISSION:  06/16/2005  DATE OF DISCHARGE:  06/18/2005                                 DISCHARGE SUMMARY   REASON FOR ADMISSION:  Vaginal bleeding at [redacted] weeks gestation.   DISCHARGE DIAGNOSES:  1.  Vaginal bleeding, resolved.  2.  Eighteen-week intrauterine pregnancy.   LABORATORY DATA:  Hemoglobin 11.6, platelets 279.  DIC panel negative.  Urine drug screen negative.  Blood type A positive.   IMAGING:  The ultrasound on June 16, 2005 showed grade 1 placenta with  no previa, estimated gestational age [redacted] weeks and 1 day.  normal anatomy.  Cervix 2.1 cm.  No visible explanation of the patient's vaginal bleeding.   HOSPITAL COURSE:  The patient is a 30 year old gravida 5, para 1-0-3-1 at 82-  2/7 weeks, dated by last menstrual period consistent with a seven-week  ultrasound who presented with sudden onset of bright red vaginal bleeding at  1930 hours.  The patient said that she had just returned from the bathroom  when she sat down in a chair.  She saturated her clothing.  The blood ran  down her leg.  She also referred slight abdominal cramping.  Bleeding had  decreased by the time of admission.  Of significance is that she had had an  ultrasound at [facility] on the day of admission that was within  normal limits and her last intercourse was four months ago.   On admission sterile speculum exam revealed bright red blood in the vagina.  However, her cervix looked closed and there was no obvious source of the  bleeding.  Labs done as described above.  The patient was admitted for  observation.  Bleeding diminished to only minimal amount of occasional  spotting on toilet paper.  The patient was stable for discharge.   Please  note that the patient also had a 24-hour urine during this admission because  she was noted to have some elevated blood pressures.   DISPOSITION:  To home.   FOLLOW UP:  With Dr. Marcelle Overlie next week.   DISCHARGE INSTRUCTIONS:  The patient is to return if she has more vaginal  bleeding or abdominal cramping.   DISCHARGE MEDICATIONS:  1.  Prenatal vitamins.  2.  DHA supplements.   DIET:  Regular.   ACTIVITY:  No heavy lifting and nothing per vagina until reevaluated.     ______________________________  Marc Morgans. Mayford Knife, M.D.    ______________________________  Lesly Dukes, M.D.    TLW/MEDQ  D:  06/18/2005  T:  06/19/2005  Job:  [id number]

## 2010-11-05 NOTE — H&P (Signed)
[facility]  Patient:    Shelley [name]                  MRN: [id number] Adm. Date:  [id number] Attending:  Deniece Ree                         History and Physical  HISTORY:                      The patient is a 30 year old gravida 5, para 0, abortus 5 who came to triage complaining of severe pain, passing large amounts f clots.  The patient had been previously seen on July 10, 1999 by Dr. Francoise Ceo and a D&C was performed for intrauterine fetal demise at approximately [redacted] weeks gestation.  On reevaluation today, it was noted that the  patient, indeed, has retained products of conception and, therefore, rescheduled for dilatation and evacuation.  This ladys past history includes five previous spontaneous abortions at approximately 10 weeks duration.  Her previous one was in July 2000.  Prior to that I was told, however, do not have any medical information as it pertains to.  PHYSICAL EXAMINATION  GENERAL:                      Revealed a well-developed, well-nourished female n abdominal distress.  HEENT:                        Within normal limits.  NECK:                         Supple.  BREASTS:                      Without masses, tenderness or discharge.  LUNGS:                        Clear to percussion and auscultation.  HEART:                        Normal sinus rhythm without murmur, rub or gallop.  ABDOMEN:                      Benign except for the mid lower quadrant where a moderate amount of pain is present.  EXTREMITIES AND NEUROLOGIC:   Within normal limits.  PELVIC EXAM:                  Revealed external genitalia, BUS to be within normal limits. The vagina has a small amount of blood in the vault. The cervix was dilated showing products of conception at the cervical os.  The uterus approximately 10  weeks in size.  Adnexa benign.  DIAGNOSIS:                    Spontaneous missed abortion with  retained products of                               conception.  PLAN:                         Dilatation and evacuation. DD:  07/16/99 TD:  07/18/99 Job: [id number]

## 2010-11-05 NOTE — Op Note (Signed)
[facility]  Patient:    Shelley [name]                  MRN: [id number] Proc. Date: 07/16/99 Adm. Date:  [id number] Attending:  Deniece Ree                           Operative Report  PREOPERATIVE DIAGNOSIS:       Spontaneous abortion with retained products of conception.  POSTOPERATIVE DIAGNOSIS:      Spontaneous abortion with retained products of conception, pending pathology.   OPERATION:                    Dilatation and evacuation and curettage.  SURGEON:                      Deniece Ree, M.D.  ANESTHESIA:                   MAC.  ESTIMATED BLOOD LOSS:         100 cc.  COMPLICATIONS:                None.  DISPOSITION:                  Patient tolerated the procedure well and returned to the recovery room in satisfactory condition.  DESCRIPTION OF PROCEDURE:     Patient was taken to the operating room and prepped and draped in usual fashion for dilatation and evacuation.  Speculum was placed in the vagina, following which the anterior lip of the cervix was then grasped with a Christella Hartigan tenaculum.  The cervical os was dilated to a size 18 Pratt dilator. With a size 9 suction curette, evacuation of the uterine cavity was carried out, removing a large amount of tissue; this was carried out until the uterus was felt to be clean.  A sharp curette was then used and the uterus scraped clean and felt to e clean.  At this point, the procedure terminated.  The patient tolerated the procedure well and returned to recovery room in satisfactory condition.  Patient is to be discharged when fully alert.  She has been instructed on the possible complications and care following this type of surgery.  She is told to  return to my office in four weeks for followup evaluation or to call me prior to that time should any problems arise. DD:  07/16/99 TD:  07/19/99 Job: [id number]

## 2010-11-05 NOTE — Op Note (Signed)
[facility]  Patient:    Shelley [name]                         MRN: [id number] Proc. Date: 07/10/99 Adm. Date:  [id number] Attending:  Deniece Ree                           Operative Report  PREOPERATIVE DIAGNOSIS:       Intrauterine fetal demise at 9.5 weeks.  POSTOPERATIVE DIAGNOSIS:  PROCEDURE:                    Dilatation and evacuation.  SURGEON:                      Kathreen Cosier, M.D.  ANESTHESIA:  DESCRIPTION OF PROCEDURE:     Using MAC, patient in lithotomy position, perineum and vagina prepped and draped and bladder emptied with a straight catheter. Bimanual exam revealed the uterus to be 10-weeks-size.  A weighted speculum was  placed in the vagina.  Cervix was injected at 12 oclock with 2 cc of 1% Xylocaine. Anterior lip of the cervix was grasped with a tenaculum.  Cervix was injected with 2 cc at 3 oclock and 2 cc at 9 oclock and the cervix dilated with a #29 Shawnie Pons. A #9 suction was used to aspirate the uterine contents, a large amount of tissue obtained and the cavity curetted until clean.  Patient tolerated procedure well and taken to recovery room in good condition. DD:  07/10/99 TD:  07/13/99 Job: [id number]

## 2010-11-05 NOTE — Op Note (Signed)
NAMEMARLINA, [name]                    ACCOUNT NO.:  [id number]   MEDICAL RECORD NO.:  [id number]                   PATIENT TYPE:  INP   LOCATION:  9113                                 [facility]  [facility]   PHYSICIAN:  Clement Husbands, M.D.         DATE OF BIRTH:  March 17, 1981   DATE OF PROCEDURE:  12/13/2002  DATE OF DISCHARGE:                                 OPERATIVE REPORT   PREOPERATIVE DIAGNOSIS:  Term pregnancy, breech presentation.   OPERATION:  Primary low transverse cervical cesarean section.   SURGEON:  Burnadette Peter, M.D.   ASSISTANT:  Scrub nurse.   ANESTHESIA:  Epidural.   PROCEDURE:  With the patient under satisfactory epidural anesthesia, the  urethra was prepped, the bladder was catheterized.  The abdomen was prepped  and then draped.  The fetal heart tones had been heard with the Doppler  prior to doing this.  A low abdominal transverse skin incision was made and  carried down through a thin subcutaneous layer to the rectus fascia, which  was then sharply transversely divided.  The peritoneum was entered  vertically.  A bladder retractor was placed inferiorly.  The vesicouterine  peritoneum was transversely incised and the bladder retractor repositioned.  A lower segment transverse incision was made.  Since she had not been in  labor, the anterior uterine wall was quite thick.  The incision also went  through the anterior placenta, which was somewhat low-lying.  One leg was  grasped and the breech was pulled down, and the other leg was then grasped.  The breech was delivered up to the thorax.  One arm was then flexed and  brought out.  The shoulders were rotated and the other arm was flexed and  brought out.  The head was very easily delivered.  Spontaneous respirations  and crying were noted.  The total time from skin incision until delivery of  the baby was eight minutes.  The cord was doubly clamped and divided.  The  infant was passed on to the  neonatal team that was in attendance.  A long  segment of cord was doubly clamped, divided, and passed off the table for  cord blood and cord pH sample.  Placenta was manually removed.  Uterus was  then explored and was normal.  The internal os was dilated.   Uterine incision was then closed with a running locking 0 Vicryl suture.  A  second layer imbricated the first, which secured hemostasis.  The  vesicouterine peritoneum was approximated with a running 2-0 Vicryl suture.   The anterior peritoneum was then closed with a running 0 Vicryl suture.  Recti muscles were approximated in the midline with a 2-0 Vicryl suture.  Rectus fascia was then closed with two 0 Vicryl sutures, one from each end  and tied separately in the midline.  The subcutaneous tissue layer was  irrigated.  Hemostasis was good.  Skin edges were approximated with wide  skin staples.  Dry sterile dressing was applied.  Estimated blood loss 1300  mL.  Sponge and needle count was correct.  The baby was a 6 pound 15 ounce  female infant with an Apgar of 8 an 9.  Arterial cord pH was 7.36.                                               Clement Husbands, M.D.   EFR/MEDQ  D:  12/13/2002  T:  12/15/2002  Job:  [id number]

## 2010-11-05 NOTE — Op Note (Signed)
Shelley [name], Shelley [name]        ACCOUNT NO.:  [id number]   MEDICAL RECORD NO.:  [id number]          PATIENT TYPE:  AMB   LOCATION:  SDC                           [facility]   PHYSICIAN:  Lesly Dukes, M.D. DATE OF BIRTH:  1980/09/23   DATE OF PROCEDURE:  10/14/2004  DATE OF DISCHARGE:                                 OPERATIVE REPORT   PREOPERATIVE DIAGNOSIS:  A 30 year old para 1-0-3-1, with an approximately  10 week 2 day missed abortion.   POSTOPERATIVE DIAGNOSIS:  A 30 year old para 1-0-3-1, with an approximately  10 week 2 day missed abortion.   PROCEDURE:  Dilation and vacuum curettage.   SURGEON:  Lesly Dukes, M.D.   ANESTHESIA:  MAC and local.   PATHOLOGY:  Endometrial curettings.   ESTIMATED BLOOD LOSS:  300.   COMPLICATIONS:  None.   FINDINGS:  The uterus sounds to 13 cm, anteverted.   PROCEDURE:  After informed consent was obtained, the patient was taken to  the operating room, where IV sedation was given.  The patient was in the  dorsal lithotomy position and prepared and draped in the normal sterile  fashion.  The bladder was in-and-out catheterized and completely emptied.  A  __________ was performed with an approximately 12-week size uterus coming to  the pelvic brim.  A weighted speculum was then placed into the patient's  vagina and the anterior lip of the cervix was grasped with a single-tooth  tenaculum.  Lidocaine 1% 10 mL was injected at 3 o'clock and another 10 mL  was injected at 9 o'clock for local anesthesia.  The cervical os was gently  dilated with the Hegar dilators to a #11.  The uterus was sounded to 13 cm.  A #10 curved curette was gently introduced to the uterus.  A gentle suction  curette was performed, the suction curette was removed and a sharp curette  was gently introduced into the uterus, where a gentle sharp curettage was  performed.  All four sides of the uterus had a good cry.  One last passage  of the suction curette  was then performed to ensure that all POC was removed  from the uterus.  All instruments were removed from the patient's vagina.  The cervix required cautery with  the Bovie at the tenaculum site.  Good hemostasis was noted at the end of  the procedure.  The patient tolerated the procedure well.  The sponge, lap  and needle and instrument count were correct x2.  The patient went to the  recovery room in stable condition      KHL/MEDQ  D:  10/14/2004  T:  10/14/2004  Job:  [id number]

## 2011-03-15 LAB — POCT URINALYSIS DIP (DEVICE)
Glucose, UA: NEGATIVE
Nitrite: NEGATIVE
Operator id: 297281
Urobilinogen, UA: 0.2

## 2011-03-16 LAB — CBC
HCT: 24.6 — ABNORMAL LOW
Hemoglobin: 8.3 — ABNORMAL LOW
MCHC: 33.8
MCV: 81.2
RBC: 3.03 — ABNORMAL LOW
RDW: 14.5

## 2014-11-01 ENCOUNTER — Encounter (HOSPITAL_COMMUNITY): Payer: Self-pay | Admitting: *Deleted

## 2014-11-01 ENCOUNTER — Inpatient Hospital Stay (HOSPITAL_COMMUNITY): Payer: Medicaid Other

## 2014-11-01 ENCOUNTER — Inpatient Hospital Stay (HOSPITAL_COMMUNITY)
Admission: AD | Admit: 2014-11-01 | Discharge: 2014-11-01 | Disposition: A | Discharge status: Home / Self Care | Payer: Medicaid Other | Source: Ambulatory Visit | Attending: Obstetrics & Gynecology | Admitting: Obstetrics & Gynecology

## 2014-11-01 DIAGNOSIS — O209 Hemorrhage in early pregnancy, unspecified: Secondary | ICD-10-CM

## 2014-11-01 DIAGNOSIS — I10 Essential (primary) hypertension: Secondary | ICD-10-CM | POA: Diagnosis not present

## 2014-11-01 DIAGNOSIS — O021 Missed abortion: Secondary | ICD-10-CM | POA: Diagnosis not present

## 2014-11-01 LAB — URINALYSIS, ROUTINE W REFLEX MICROSCOPIC
Bilirubin Urine: NEGATIVE
GLUCOSE, UA: NEGATIVE mg/dL
Ketones, ur: NEGATIVE mg/dL
LEUKOCYTES UA: NEGATIVE
Nitrite: NEGATIVE
PROTEIN: NEGATIVE mg/dL
Specific Gravity, Urine: 1.01 (ref 1.005–1.030)
Urobilinogen, UA: 0.2 mg/dL (ref 0.0–1.0)
pH: 6 (ref 5.0–8.0)

## 2014-11-01 LAB — POCT PREGNANCY, URINE: PREG TEST UR: POSITIVE — AB

## 2014-11-01 LAB — HCG, QUANTITATIVE, PREGNANCY: hCG, Beta Chain, Quant, S: 15293 m[IU]/mL — ABNORMAL HIGH (ref <5)

## 2014-11-01 LAB — URINE MICROSCOPIC-ADD ON

## 2014-11-01 LAB — CBC
HEMATOCRIT: 34.8 % — AB (ref 36.0–46.0)
Hemoglobin: 11.1 g/dL — ABNORMAL LOW (ref 12.0–15.0)
MCH: 25 pg — ABNORMAL LOW (ref 26.0–34.0)
MCHC: 31.9 g/dL (ref 30.0–36.0)
MCV: 78.4 fL (ref 78.0–100.0)
PLATELETS: 229 10*3/uL (ref 150–400)
RBC: 4.44 MIL/uL (ref 3.87–5.11)
RDW: 16.1 % — ABNORMAL HIGH (ref 11.5–15.5)
WBC: 7 10*3/uL (ref 4.0–10.5)

## 2014-11-01 LAB — WET PREP, GENITAL
Clue Cells Wet Prep HPF POC: NONE SEEN
Trich, Wet Prep: NONE SEEN
YEAST WET PREP: NONE SEEN

## 2014-11-01 MED ORDER — IBUPROFEN 600 MG PO TABS: 600.0000 mg | ORAL_TABLET | Freq: Four times a day (QID) | ORAL | Status: DC | PRN

## 2014-11-01 NOTE — MAU Provider Note (Signed)
Chief Complaint  Patient presents with  . Vaginal Bleeding    Subjective Shelley Young 34 y.o.  [id number] at [redacted]w[redacted]d by LMP presents with onset today of first episode of small amount red vaginal bleeding at 1500. Has menstrual-like crampy lower abdominal pain. Last intercourse several weeks ago. Denies irritative vaginal discharge. No dysuria or hematuria.  Blood type: A+  Pregnancy course: Has NOB appointment at [facility]5/16/16.   Pertinent Medical History: Obesity; CHTN on Labetalol last pregnancy; no current   meds Pertinent Ob/Gyn History: LTCS x3; SAB x6; hx preE  Pertinent Surgical History: LTCS x3, D&E's Pertinent Social History: nonsmoker  Prescriptions prior to admission  Medication Sig Dispense Refill Last Dose  . Prenatal Vit-Fe Fumarate-FA (PRENATAL MULTIVITAMIN) TABS tablet Take 1 tablet by mouth at bedtime.   10/31/2014 at Unknown time    No Known Allergies Review of Systems  Constitutional: Negative for fever.  Respiratory: Negative for shortness of breath.   Cardiovascular: Negative for chest pain and palpitations.  Gastrointestinal: Positive for nausea. Negative for heartburn, vomiting and abdominal pain.  Genitourinary: Negative for dysuria, urgency, frequency, hematuria and flank pain.  Neurological: Negative for dizziness and headaches.  Psychiatric/Behavioral: Negative for depression.     Objective   Filed Vitals:   11/01/14 1656  BP: 155/87  Pulse: 94  Temp: 99.4 F (37.4 C)  Resp: 18     Physical Exam General: Obese  in NAD  Abdom: soft, NT Lungs: CTA bilaterally Cor: RRR without murmur Back: neg CVAT Pelvic: NEFG; vagina with small amount brown blood; cervix with no lesions, appears closed Bimanual: Cervix closed, long; uterus anteverted, NT, slightly enlarged; adnexa nontender, no masses   Lab Results Results for orders placed or performed during the hospital encounter of 11/01/14 (from the past 24 hour(s))  Urinalysis, Routine w reflex  microscopic     Status: Abnormal   Collection Time: 11/01/14  4:33 PM  Result Value Ref Range   Color, Urine YELLOW YELLOW   APPearance CLEAR CLEAR   Specific Gravity, Urine 1.010 1.005 - 1.030   pH 6.0 5.0 - 8.0   Glucose, UA NEGATIVE NEGATIVE mg/dL   Hgb urine dipstick SMALL (A) NEGATIVE   Bilirubin Urine NEGATIVE NEGATIVE   Ketones, ur NEGATIVE NEGATIVE mg/dL   Protein, ur NEGATIVE NEGATIVE mg/dL   Urobilinogen, UA 0.2 0.0 - 1.0 mg/dL   Nitrite NEGATIVE NEGATIVE   Leukocytes, UA NEGATIVE NEGATIVE  Urine microscopic-add on     Status: Abnormal   Collection Time: 11/01/14  4:33 PM  Result Value Ref Range   Squamous Epithelial / LPF FEW (A) RARE   WBC, UA 0-2 <3 WBC/hpf   RBC / HPF 0-2 <3 RBC/hpf  Pregnancy, urine POC     Status: Abnormal   Collection Time: 11/01/14  4:48 PM  Result Value Ref Range   Preg Test, Ur POSITIVE (A) NEGATIVE  Wet prep, genital     Status: Abnormal   Collection Time: 11/01/14  6:30 PM  Result Value Ref Range   Yeast Wet Prep HPF POC NONE SEEN NONE SEEN   Trich, Wet Prep NONE SEEN NONE SEEN   Clue Cells Wet Prep HPF POC NONE SEEN NONE SEEN   WBC, Wet Prep HPF POC FEW (A) NONE SEEN  CBC     Status: Abnormal   Collection Time: 11/01/14  6:40 PM  Result Value Ref Range   WBC 7.0 4.0 - 10.5 K/uL   RBC 4.44 3.87 - 5.11 MIL/uL   Hemoglobin 11.1 (  L) 12.0 - 15.0 g/dL   HCT 34.8 (L) 36.0 - 46.0 %   MCV 78.4 78.0 - 100.0 fL   MCH 25.0 (L) 26.0 - 34.0 pg   MCHC 31.9 30.0 - 36.0 g/dL   RDW 16.1 (H) 11.5 - 15.5 %   Platelets 229 150 - 400 K/uL  hCG, quantitative, pregnancy     Status: Abnormal   Collection Time: 11/01/14  6:40 PM  Result Value Ref Range   hCG, Beta Chain, Quant, S 15293 (H) <5 mIU/mL    Ultrasound   CLINICAL DATA: Vaginal bleeding. Positive urine pregnancy test. Quantitative beta HCG pending. Estimated gestational age per LMP is 9 weeks 5 days.  EXAM: OBSTETRIC <14 WK [place] AND TRANSVAGINAL OB US  TECHNIQUE: Both  transabdominal and transvaginal ultrasound examinations were performed for complete evaluation of the gestation as well as the maternal uterus, adnexal regions, and pelvic cul-de-sac. Transvaginal technique was performed to assess early pregnancy.  COMPARISON: None.  FINDINGS: Intrauterine gestational sac: Single irregular-shaped gestational sac.  Yolk sac: Not visualized.  Embryo: Visualized.  Cardiac Activity: Not visualized.  Heart Rate: Not visualized.  CRL: 12.5 mm mm 7 w 4 d [place] EDC: 06/13/2015  Maternal uterus/adnexae: Calcified fibroid over the right anterior uterine fundus measuring 1.2 cm. Ovaries are normal in size, shape and position. No free pelvic fluid.  IMPRESSION: Findings compatible with failed IUP.   Electronically Signed  By: Marin Olp M.D.  On: 11/01/2014 19:35 MAU Course: Reviewed [place] result and EPF with patient and offered cytotec induction, expectant management, D&E> All questions answered and she elects expectant management.   Assessment 1. Missed abortion   2. Bleeding in early pregnancy   3. Essential hypertension      Plan    GC/CT sent Discharge home with bleeding precautions See AVS for pt education   Medication List    STOP taking these medications        prenatal multivitamin Tabs tablet        Follow-up Information    Follow up with [facility] In 2 weeks.   Specialty:  Obstetrics and Gynecology   Why:  Someone from Clinic will call you with appt.   Contact information:   [place] [phone]      Ellamay Fors 11/01/2014 6:18 PM

## 2014-11-01 NOTE — MAU Note (Signed)
Pt noticed some bleeding when she went to the bathroom at 1500, is not bleeding at the moment and does not have any pain.  No intercourse since she found out she was pregnant.

## 2014-11-01 NOTE — Discharge Instructions (Signed)
Incomplete Miscarriage A miscarriage is the sudden loss of an unborn baby (fetus) before the 20th week of pregnancy. In an incomplete miscarriage, parts of the fetus or placenta (afterbirth) remain in the body.  Having a miscarriage can be an emotional experience. Talk with your health care provider about any questions you may have about miscarrying, the grieving process, and your future pregnancy plans. CAUSES   Problems with the fetal chromosomes that make it impossible for the baby to develop normally. Problems with the baby's genes or chromosomes are most often the result of errors that occur by chance as the embryo divides and grows. The problems are not inherited from the parents.  Infection of the cervix or uterus.  Hormone problems.  Problems with the cervix, such as having an incompetent cervix. This is when the tissue in the cervix is not strong enough to hold the pregnancy.  Problems with the uterus, such as an abnormally shaped uterus, uterine fibroids, or congenital abnormalities.  Certain medical conditions.  Smoking, drinking alcohol, or taking illegal drugs.  Trauma. SYMPTOMS   Vaginal bleeding or spotting, with or without cramps or pain.  Pain or cramping in the abdomen or lower back.  Passing fluid, tissue, or blood clots from the vagina. DIAGNOSIS  Your health care provider will perform a physical exam. You may also have an ultrasound to confirm the miscarriage. Blood or urine tests may also be ordered. TREATMENT   Usually, a dilation and curettage (D&C) procedure is performed. During a D&C procedure, the cervix is widened (dilated) and any remaining fetal or placental tissue is gently removed from the uterus.  Antibiotic medicines are prescribed if there is an infection. Other medicines may be given to reduce the size of the uterus (contract) if there is a lot of bleeding.  If you have Rh negative blood and your baby was Rh positive, you will need a Rho (D)  immune globulin shot. This shot will protect any future baby from having Rh blood problems in future pregnancies.  You may be confined to bed rest. This means you should stay in bed and only get up to use the bathroom. HOME CARE INSTRUCTIONS   Rest as directed by your health care provider.  Restrict activity as directed by your health care provider. You may be allowed to continue light activity if curettage was not done but you require further treatment.  Keep track of the number of pads you use each day. Keep track of how soaked (saturated) they are. Record this information.  Do not  use tampons.  Do not douche or have sexual intercourse until approved by your health care provider.  Keep all follow-up appointments for reevaluation and continuing management.  Only take over-the-counter or prescription medicines for pain, fever, or discomfort as directed by your health care provider.  Take antibiotic medicine as directed by your health care provider. Make sure you finish it even if you start to feel better. SEEK IMMEDIATE MEDICAL CARE IF:   You experience severe cramps in your stomach, back, or abdomen.  You have an unexplained temperature (make sure to record these temperatures).  You pass large clots or tissue (save these for your health care provider to inspect).  Your bleeding increases.  You become light-headed, weak, or have fainting episodes. MAKE SURE YOU:   Understand these instructions.  Will watch your condition.  Will get help right away if you are not doing well or get worse. Document Released: 06/06/2005 Document Revised: 10/21/2013 Document Reviewed:   01/03/2013 ExitCare Patient Information 2015 [employer]. This information is not intended to replace advice given to you by your health care provider. Make sure you discuss any questions you have with your health care provider.  

## 2014-11-02 LAB — HIV ANTIBODY (ROUTINE TESTING W REFLEX): HIV Screen 4th Generation wRfx: NONREACTIVE

## 2014-11-03 ENCOUNTER — Encounter (HOSPITAL_COMMUNITY): Payer: Self-pay

## 2014-11-03 ENCOUNTER — Inpatient Hospital Stay (HOSPITAL_COMMUNITY)
Admission: AD | Admit: 2014-11-03 | Discharge: 2014-11-03 | Disposition: A | Discharge status: Home / Self Care | Payer: Medicaid Other | Source: Ambulatory Visit | Attending: Family Medicine | Admitting: Family Medicine

## 2014-11-03 DIAGNOSIS — O039 Complete or unspecified spontaneous abortion without complication: Secondary | ICD-10-CM | POA: Insufficient documentation

## 2014-11-03 DIAGNOSIS — Z3A10 10 weeks gestation of pregnancy: Secondary | ICD-10-CM | POA: Insufficient documentation

## 2014-11-03 DIAGNOSIS — O0330 Unspecified complication following incomplete spontaneous abortion: Secondary | ICD-10-CM | POA: Diagnosis not present

## 2014-11-03 HISTORY — DX: Essential (primary) hypertension: I10

## 2014-11-03 HISTORY — DX: Complete or unspecified spontaneous abortion without complication: O03.9

## 2014-11-03 LAB — CBC WITH DIFFERENTIAL/PLATELET
BASOS ABS: 0 10*3/uL (ref 0.0–0.1)
BASOS PCT: 0 % (ref 0–1)
EOS ABS: 0.1 10*3/uL (ref 0.0–0.7)
EOS PCT: 1 % (ref 0–5)
HEMATOCRIT: 37.8 % (ref 36.0–46.0)
Hemoglobin: 12 g/dL (ref 12.0–15.0)
Lymphocytes Relative: 29 % (ref 12–46)
Lymphs Abs: 2.7 10*3/uL (ref 0.7–4.0)
MCH: 25 pg — ABNORMAL LOW (ref 26.0–34.0)
MCHC: 31.7 g/dL (ref 30.0–36.0)
MCV: 78.8 fL (ref 78.0–100.0)
Monocytes Absolute: 0.8 10*3/uL (ref 0.1–1.0)
Monocytes Relative: 9 % (ref 3–12)
Neutro Abs: 5.5 10*3/uL (ref 1.7–7.7)
Neutrophils Relative %: 61 % (ref 43–77)
Platelets: 240 10*3/uL (ref 150–400)
RBC: 4.8 MIL/uL (ref 3.87–5.11)
RDW: 15.8 % — AB (ref 11.5–15.5)
WBC: 9.2 10*3/uL (ref 4.0–10.5)

## 2014-11-03 LAB — ABO/RH: ABO/RH(D): A POS

## 2014-11-03 LAB — GC/CHLAMYDIA PROBE AMP (~~LOC~~) NOT AT ARMC
CHLAMYDIA, DNA PROBE: NEGATIVE
Neisseria Gonorrhea: NEGATIVE

## 2014-11-03 MED ORDER — PROMETHAZINE HCL 25 MG/ML IJ SOLN
12.5000 mg | Freq: Once | INTRAMUSCULAR | Status: AC
  Administered 2014-11-03: 12.5 mg via INTRAVENOUS
  Filled 2014-11-03: qty 1

## 2014-11-03 MED ORDER — OXYCODONE-ACETAMINOPHEN 7.5-325 MG PO TABS: 1.0000 | ORAL_TABLET | ORAL | Status: DC | PRN

## 2014-11-03 MED ORDER — NALBUPHINE HCL 10 MG/ML IJ SOLN
10.0000 mg | Freq: Once | INTRAMUSCULAR | Status: AC
  Administered 2014-11-03: 10 mg via INTRAMUSCULAR
  Filled 2014-11-03: qty 1

## 2014-11-03 MED ORDER — MISOPROSTOL 200 MCG PO TABS: 800.0000 ug | ORAL_TABLET | Freq: Once | ORAL | Status: DC

## 2014-11-03 NOTE — Discharge Instructions (Signed)
Incomplete Miscarriage A miscarriage is the sudden loss of an unborn baby (fetus) before the 20th week of pregnancy. In an incomplete miscarriage, parts of the fetus or placenta (afterbirth) remain in the body.  Having a miscarriage can be an emotional experience. Talk with your health care provider about any questions you may have about miscarrying, the grieving process, and your future pregnancy plans. CAUSES   Problems with the fetal chromosomes that make it impossible for the baby to develop normally. Problems with the baby's genes or chromosomes are most often the result of errors that occur by chance as the embryo divides and grows. The problems are not inherited from the parents.  Infection of the cervix or uterus.  Hormone problems.  Problems with the cervix, such as having an incompetent cervix. This is when the tissue in the cervix is not strong enough to hold the pregnancy.  Problems with the uterus, such as an abnormally shaped uterus, uterine fibroids, or congenital abnormalities.  Certain medical conditions.  Smoking, drinking alcohol, or taking illegal drugs.  Trauma. SYMPTOMS   Vaginal bleeding or spotting, with or without cramps or pain.  Pain or cramping in the abdomen or lower back.  Passing fluid, tissue, or blood clots from the vagina. DIAGNOSIS  Your health care provider will perform a physical exam. You may also have an ultrasound to confirm the miscarriage. Blood or urine tests may also be ordered. TREATMENT   Usually, a dilation and curettage (D&C) procedure is performed. During a D&C procedure, the cervix is widened (dilated) and any remaining fetal or placental tissue is gently removed from the uterus.  Antibiotic medicines are prescribed if there is an infection. Other medicines may be given to reduce the size of the uterus (contract) if there is a lot of bleeding.  If you have Rh negative blood and your baby was Rh positive, you will need a Rho (D)  immune globulin shot. This shot will protect any future baby from having Rh blood problems in future pregnancies.  You may be confined to bed rest. This means you should stay in bed and only get up to use the bathroom. HOME CARE INSTRUCTIONS   Rest as directed by your health care provider.  Restrict activity as directed by your health care provider. You may be allowed to continue light activity if curettage was not done but you require further treatment.  Keep track of the number of pads you use each day. Keep track of how soaked (saturated) they are. Record this information.  Do not  use tampons.  Do not douche or have sexual intercourse until approved by your health care provider.  Keep all follow-up appointments for reevaluation and continuing management.  Only take over-the-counter or prescription medicines for pain, fever, or discomfort as directed by your health care provider.  Take antibiotic medicine as directed by your health care provider. Make sure you finish it even if you start to feel better. SEEK IMMEDIATE MEDICAL CARE IF:   You experience severe cramps in your stomach, back, or abdomen.  You have an unexplained temperature (make sure to record these temperatures).  You pass large clots or tissue (save these for your health care provider to inspect).  Your bleeding increases.  You become light-headed, weak, or have fainting episodes. MAKE SURE YOU:   Understand these instructions.  Will watch your condition.  Will get help right away if you are not doing well or get worse. Document Released: 06/06/2005 Document Revised: 10/21/2013 Document Reviewed:   01/03/2013 ExitCare Patient Information 2015 [employer]. This information is not intended to replace advice given to you by your health care provider. Make sure you discuss any questions you have with your health care provider.  

## 2014-11-03 NOTE — MAU Note (Signed)
Pt reports she was given cytotec to complete her miscarriage. Took around 11am. Cramping increased no bleeding. Taking ibuprofen for pain but not helping.

## 2014-11-03 NOTE — MAU Provider Note (Signed)
  History   [id number] @ 10 weeks with failed IUP . pev u/s on 14th showed non viable pregnancy at 7.3 wks. Used her rx for cytotec today and now is in withh severe cramping and starting to bleed.  CSN: [id number]  Arrival date and time: 11/03/14 1558   None     Chief Complaint  Patient presents with  . Miscarriage   HPI  OB History    Gravida Para Term Preterm AB TAB SAB Ectopic Multiple Living   10 3  3 6  6   3       No past medical history on file.  Past Surgical History  Procedure Laterality Date  . Cesarean section      No family history on file.  History  Substance Use Topics  . Smoking status: Never Smoker   . Smokeless tobacco: Not on file  . Alcohol Use: No    Allergies: No Known Allergies  Prescriptions prior to admission  Medication Sig Dispense Refill Last Dose  . ibuprofen (ADVIL,MOTRIN) 600 MG tablet Take 1 tablet (600 mg total) by mouth every 6 (six) hours as needed. 30 tablet 1     Review of Systems  Constitutional: Negative.   HENT: Negative.   Eyes: Negative.   Respiratory: Negative.   Cardiovascular: Negative.   Gastrointestinal: Positive for abdominal pain.  Genitourinary:       Sm amt vag bleeding at present  Musculoskeletal: Negative.   Skin: Negative.   Neurological: Negative.   Endo/Heme/Allergies: Negative.   Psychiatric/Behavioral: Negative.    Physical Exam   Blood pressure 163/95, pulse 86, temperature 99 F (37.2 C), resp. rate 18, height 5\' 1"  (1.549 m), weight 204 lb (92.534 kg), last menstrual period 08/25/2014.  Physical Exam  Constitutional: She is oriented to person, place, and time. She appears well-developed and well-nourished.  HENT:  Head: Normocephalic.  Neck: Normal range of motion.  Cardiovascular: Normal rate, regular rhythm, normal heart sounds and intact distal pulses.   Respiratory: Effort normal and breath sounds normal.  GI: Soft. Bowel sounds are normal.  Genitourinary: Vagina normal and uterus  normal.  Musculoskeletal: Normal range of motion.  Neurological: She is alert and oriented to person, place, and time. She has normal reflexes.  Skin: Skin is warm and dry.  Psychiatric: She has a normal mood and affect. Her behavior is normal. Judgment and thought content normal.    MAU Course  Procedures  MDM Failed IUP at 10 wks abd pain and bleeding  Assessment and Plan  Pain management and d/c home. Will give Rx for pain management and refill on cytotec. If no ab in 3 days to contact clinic for appt.  POLETTE, NOFSINGER DARLENE 11/03/2014, 5:16 PM

## 2014-11-21 ENCOUNTER — Encounter: Payer: Self-pay | Admitting: Obstetrics & Gynecology

## 2014-11-21 ENCOUNTER — Ambulatory Visit (INDEPENDENT_AMBULATORY_CARE_PROVIDER_SITE_OTHER): Payer: Medicaid Other | Admitting: Obstetrics & Gynecology

## 2014-11-21 VITALS — BP 139/91 | HR 84 | Temp 99.0°F | Ht 61.0 in | Wt 198.9 lb

## 2014-11-21 DIAGNOSIS — O039 Complete or unspecified spontaneous abortion without complication: Secondary | ICD-10-CM | POA: Diagnosis present

## 2014-11-21 NOTE — Progress Notes (Signed)
Shelley Young here for miscarriage follow up. States after 2nd dose cytotec states had heavy bleeding. Now bleeding just a little. States she did not take her blood pressure medicine yet today because she has not eaten yet.

## 2014-11-21 NOTE — Patient Instructions (Addendum)
Contraception Choices Contraception (birth control) is the use of any methods or devices to prevent pregnancy. Below are some methods to help avoid pregnancy. HORMONAL METHODS   Contraceptive implant. This is a thin, plastic tube containing progesterone hormone. It does not contain estrogen hormone. Your health care provider inserts the tube in the inner part of the upper arm. The tube can remain in place for up to 3 years. After 3 years, the implant must be removed. The implant prevents the ovaries from releasing an egg (ovulation), thickens the cervical mucus to prevent sperm from entering the uterus, and thins the lining of the inside of the uterus.  Progesterone-only injections. These injections are given every 3 months by your health care provider to prevent pregnancy. This synthetic progesterone hormone stops the ovaries from releasing eggs. It also thickens cervical mucus and changes the uterine lining. This makes it harder for sperm to survive in the uterus.  Birth control pills. These pills contain estrogen and progesterone hormone. They work by preventing the ovaries from releasing eggs (ovulation). They also cause the cervical mucus to thicken, preventing the sperm from entering the uterus. Birth control pills are prescribed by a health care provider.Birth control pills can also be used to treat heavy periods.  Minipill. This type of birth control pill contains only the progesterone hormone. They are taken every day of each month and must be prescribed by your health care provider.  Birth control patch. The patch contains hormones similar to those in birth control pills. It must be changed once a week and is prescribed by a health care provider.  Vaginal ring. The ring contains hormones similar to those in birth control pills. It is left in the vagina for 3 weeks, removed for 1 week, and then a new one is put back in place. The patient must be comfortable inserting and removing the ring  from the vagina.A health care provider's prescription is necessary.  Emergency contraception. Emergency contraceptives prevent pregnancy after unprotected sexual intercourse. This pill can be taken right after sex or up to 5 days after unprotected sex. It is most effective the sooner you take the pills after having sexual intercourse. Most emergency contraceptive pills are available without a prescription. Check with your pharmacist. Do not use emergency contraception as your only form of birth control. BARRIER METHODS   Female condom. This is a thin sheath (latex or rubber) that is worn over the penis during sexual intercourse. It can be used with spermicide to increase effectiveness.  Female condom. This is a soft, loose-fitting sheath that is put into the vagina before sexual intercourse.  Diaphragm. This is a soft, latex, dome-shaped barrier that must be fitted by a health care provider. It is inserted into the vagina, along with a spermicidal jelly. It is inserted before intercourse. The diaphragm should be left in the vagina for 6 to 8 hours after intercourse.  Cervical cap. This is a round, soft, latex or plastic cup that fits over the cervix and must be fitted by a health care provider. The cap can be left in place for up to 48 hours after intercourse.  Sponge. This is a soft, circular piece of polyurethane foam. The sponge has spermicide in it. It is inserted into the vagina after wetting it and before sexual intercourse.  Spermicides. These are chemicals that kill or block sperm from entering the cervix and uterus. They come in the form of creams, jellies, suppositories, foam, or tablets. They do not require a   prescription. They are inserted into the vagina with an applicator before having sexual intercourse. The process must be repeated every time you have sexual intercourse. INTRAUTERINE CONTRACEPTION  Intrauterine device (IUD). This is a T-shaped device that is put in a woman's uterus  during a menstrual period to prevent pregnancy. There are 2 types:  Copper IUD. This type of IUD is wrapped in copper wire and is placed inside the uterus. Copper makes the uterus and fallopian tubes produce a fluid that kills sperm. It can stay in place for 10 years.  Hormone IUD. This type of IUD contains the hormone progestin (synthetic progesterone). The hormone thickens the cervical mucus and prevents sperm from entering the uterus, and it also thins the uterine lining to prevent implantation of a fertilized egg. The hormone can weaken or kill the sperm that get into the uterus. It can stay in place for 3-5 years, depending on which type of IUD is used. PERMANENT METHODS OF CONTRACEPTION  Female tubal ligation. This is when the woman's fallopian tubes are surgically sealed, tied, or blocked to prevent the egg from traveling to the uterus.  Hysteroscopic sterilization. This involves placing a small coil or insert into each fallopian tube. Your doctor uses a technique called hysteroscopy to do the procedure. The device causes scar tissue to form. This results in permanent blockage of the fallopian tubes, so the sperm cannot fertilize the egg. It takes about 3 months after the procedure for the tubes to become blocked. You must use another form of birth control for these 3 months.  Female sterilization. This is when the female has the tubes that carry sperm tied off (vasectomy).This blocks sperm from entering the vagina during sexual intercourse. After the procedure, the man can still ejaculate fluid (semen). NATURAL PLANNING METHODS  Natural family planning. This is not having sexual intercourse or using a barrier method (condom, diaphragm, cervical cap) on days the woman could become pregnant.  Calendar method. This is keeping track of the length of each menstrual cycle and identifying when you are fertile.  Ovulation method. This is avoiding sexual intercourse during ovulation.  Symptothermal  method. This is avoiding sexual intercourse during ovulation, using a thermometer and ovulation symptoms.  Post-ovulation method. This is timing sexual intercourse after you have ovulated. Regardless of which type or method of contraception you choose, it is important that you use condoms to protect against the transmission of sexually transmitted infections (STIs). Talk with your health care provider about which form of contraception is most appropriate for you. Document Released: 06/06/2005 Document Revised: 06/11/2013 Document Reviewed: 11/29/2012 ExitCare Patient Information 2015 [employer]. This information is not intended to replace advice given to you by your health care provider. Make sure you discuss any questions you have with your health care provider.  

## 2014-11-21 NOTE — Progress Notes (Signed)
Patient ID: Shelley Young, female   DOB: [birth year], 34 y.o.   MRN: [id number]  Chief Complaint  Patient presents with  . Miscarriage    HPI Shelley Young is a 34 y.o. female.  [id number] Patient's last menstrual period was 08/25/2014. Patient took cytotec after failed pregnancy was diagnosed 11/01/14 7+ weeks, bled heavily 10 days after. Wants BCM, is a [facility] patient. Started BP med recently   HPI  Past Medical History  Diagnosis Date  . Miscarriage     x6  . Hypertension     Past Surgical History  Procedure Laterality Date  . Cesarean section      History reviewed. No pertinent family history.  Social History History  Substance Use Topics  . Smoking status: Never Smoker   . Smokeless tobacco: Never Used  . Alcohol Use: No    No Known Allergies  Current Outpatient Prescriptions  Medication Sig Dispense Refill  . lisinopril-hydrochlorothiazide (PRINZIDE,ZESTORETIC) 10-12.5 MG per tablet Take 1 tablet by mouth daily.    Marland Kitchen oxyCODONE-acetaminophen (PERCOCET) 7.5-325 MG per tablet Take 1 tablet by mouth every 4 (four) hours as needed for severe pain. 30 tablet 0  . ibuprofen (ADVIL,MOTRIN) 600 MG tablet Take 1 tablet (600 mg total) by mouth every 6 (six) hours as needed. (Patient not taking: Reported on 11/21/2014) 30 tablet 1  . Prenatal Vit-Fe Fumarate-FA (PRENATAL MULTIVITAMIN) TABS tablet Take 1 tablet by mouth daily at 12 noon.     No current facility-administered medications for this visit.    Review of Systems Review of Systems  Constitutional: Negative.   Genitourinary: Negative for vaginal bleeding, vaginal discharge, menstrual problem and pelvic pain.    Blood pressure 139/91, pulse 84, temperature 99 F (37.2 C), height 5\' 1"  (1.549 m), weight 198 lb 14.4 oz (90.22 kg), last menstrual period 08/25/2014.  Physical Exam Physical Exam  Constitutional: She is oriented to person, place, and time. She appears well-developed. No distress.  Abdominal: She  exhibits no mass. There is no tenderness.  Genitourinary: Uterus normal. Vaginal discharge (slight mucus d/c) found.  Neurological: She is alert and oriented to person, place, and time.  Skin: Skin is warm and dry.  Psychiatric: She has a normal mood and affect. Her behavior is normal.    Data Reviewed MAU note, [place] report, meds  Assessment    S/P miscarriage, wants BCM.      Plan    F/U at [facility] for [facility] as she is on BP med. Condoms recommended.  10 min FTF and coordination of care       ARNOLD,JAMES 11/21/2014, 9:14 AM

## 2017-10-10 ENCOUNTER — Inpatient Hospital Stay (HOSPITAL_COMMUNITY)
Admission: AD | Admit: 2017-10-10 | Discharge: 2017-10-10 | Disposition: A | Discharge status: Home / Self Care | Payer: Medicaid Other | Source: Ambulatory Visit | Attending: Obstetrics & Gynecology | Admitting: Obstetrics & Gynecology

## 2017-10-10 ENCOUNTER — Encounter (HOSPITAL_COMMUNITY): Payer: Self-pay

## 2017-10-10 DIAGNOSIS — O161 Unspecified maternal hypertension, first trimester: Secondary | ICD-10-CM | POA: Insufficient documentation

## 2017-10-10 DIAGNOSIS — O10911 Unspecified pre-existing hypertension complicating pregnancy, first trimester: Secondary | ICD-10-CM | POA: Diagnosis not present

## 2017-10-10 DIAGNOSIS — O10919 Unspecified pre-existing hypertension complicating pregnancy, unspecified trimester: Secondary | ICD-10-CM

## 2017-10-10 DIAGNOSIS — Z3A01 Less than 8 weeks gestation of pregnancy: Secondary | ICD-10-CM | POA: Diagnosis not present

## 2017-10-10 MED ORDER — LABETALOL HCL 100 MG PO TABS
200.0000 mg | ORAL_TABLET | Freq: Once | ORAL | Status: AC
  Administered 2017-10-10: 200 mg via ORAL
  Filled 2017-10-10: qty 2

## 2017-10-10 MED ORDER — LABETALOL HCL 200 MG PO TABS: 200.0000 mg | ORAL_TABLET | Freq: Two times a day (BID) | ORAL | 2 refills | Status: DC

## 2017-10-10 NOTE — MAU Note (Signed)
Pregnancy verification letter seen from [facility] department with confirmed positive pregnancy test.

## 2017-10-10 NOTE — MAU Provider Note (Signed)
History     CSN: [id number]  Arrival date and time: 10/10/17 6389   First Provider Initiated Contact with Patient 10/10/17 1006      Chief Complaint  Patient presents with  . Hypertension   HPI  [name] is a 37 y.o. [id number] at [redacted]w[redacted]d who presents to MAU today with complaint of elevated blood pressure. The patient has CHTN and is on Lizinopril/HCTZ which she did not take today. She denies abdominal pain, bleeding, fever, N/V or headache. She was seen at [facility] for pregnancy confirmation this morning and sent here for evaluation of her blood pressure.   OB History    Gravida  11   Para  3   Term      Preterm  3   AB  7   Living  3     SAB  7   TAB      Ectopic      Multiple      Live Births  3           Past Medical History:  Diagnosis Date  . Hypertension   . Miscarriage    x6    Past Surgical History:  Procedure Laterality Date  . CESAREAN SECTION      No family history on file.  Social History   Tobacco Use  . Smoking status: Never Smoker  . Smokeless tobacco: Never Used  Substance Use Topics  . Alcohol use: No  . Drug use: No    Allergies: No Known Allergies  No medications prior to admission.    Review of Systems  Constitutional: Negative for fever.  Cardiovascular: Negative for leg swelling.  Gastrointestinal: Negative for abdominal pain, constipation, diarrhea, nausea and vomiting.  Genitourinary: Negative for vaginal bleeding and vaginal discharge.  Neurological: Negative for headaches.   Physical Exam   Blood pressure 134/86, pulse 87, temperature 98.4 F (36.9 C), temperature source Oral, resp. rate 18, weight 186 lb 12.8 oz (84.7 kg), last menstrual period 08/29/2017, SpO2 100 %.  Physical Exam  Nursing note and vitals reviewed. Constitutional: She is oriented to person, place, and time. She appears well-developed and well-nourished. No distress.  HENT:  Head: Normocephalic and atraumatic.  Cardiovascular:  Normal rate.  Respiratory: Effort normal.  GI: Soft. She exhibits no distension and no mass. There is no tenderness. There is no rebound and no guarding.  Neurological: She is alert and oriented to person, place, and time.  Skin: Skin is warm and dry. No erythema.  Psychiatric: She has a normal mood and affect.    Patient Vitals for the past 24 hrs:  BP Temp Temp src Pulse Resp SpO2 Weight  10/10/17 1115 134/86 - - 87 - - -  10/10/17 1100 (!) 144/85 - - 87 - - -  10/10/17 1045 (!) 150/85 - - 80 - - -  10/10/17 1030 (!) 150/95 - - 74 - - -  10/10/17 1015 (!) 145/92 - - 78 - - -  10/10/17 1007 (!) 155/93 - - 85 - - -  10/10/17 1003 (!) 158/93 - - 77 - - -  10/10/17 1002 (!) 158/93 - - 77 18 - -  10/10/17 0943 (!) 166/98 98.4 F (36.9 C) Oral 79 16 100 % 186 lb 12.8 oz (84.7 kg)    MAU Course  Procedures None  MDM 200 mg Labetalol given in MAU. BP responded well. Patient asymptomatic.  Discussed patient with Dr. Harolyn Rutherford. Recommends Rx for Labetalol 200 mg BID.  BP check Friday in the office and refer to [facility] for NOB in 2-3 weeks.   Assessment and Plan  A: Pregnant ~ [redacted] wks GA CHTN  P: Discharge home Rx for Labetalol given to patient  Advised to discontinue Lisinopril at this time First trimester precautions discussed Patient advised to follow-up with [facility] at 8:30 am on Friday for BP check Message sent to Admin pool to schedule for NOB in 2-3 weeks Patient may return to MAU as needed or if her condition were to change or worsen  Kerry Hough, PA-C 10/10/2017, 12:00 PM

## 2017-10-10 NOTE — MAU Note (Signed)
Urine sent to lab 

## 2017-10-10 NOTE — Discharge Instructions (Signed)

## 2017-10-10 NOTE — MAU Note (Signed)
[facility]. BP was high and was told to come to MAU. She is on medication and managed for HTN with her primary care. No pain or bleeding.

## 2017-10-11 ENCOUNTER — Telehealth: Payer: Self-pay | Admitting: General Practice

## 2017-10-11 NOTE — Telephone Encounter (Signed)
Called and notified patient of appointment on 10/13/17 for BP check and New OB on 10/30/17 at 2:55pm.  Patient voiced understanding.

## 2017-10-13 ENCOUNTER — Ambulatory Visit (INDEPENDENT_AMBULATORY_CARE_PROVIDER_SITE_OTHER): Payer: Self-pay | Admitting: *Deleted

## 2017-10-13 VITALS — BP 148/85 | HR 87 | Ht 61.0 in | Wt 190.8 lb

## 2017-10-13 DIAGNOSIS — Z0130 Encounter for examination of blood pressure without abnormal findings: Secondary | ICD-10-CM

## 2017-10-13 MED ORDER — LABETALOL HCL 200 MG PO TABS: 400.0000 mg | ORAL_TABLET | Freq: Two times a day (BID) | ORAL | 2 refills | Status: DC

## 2017-10-13 NOTE — Progress Notes (Signed)
Pt reports mild H/A and slight blurry vision today. She has not taken Tylenol. BP values reported to Dr. Harolyn Rutherford. Dosage of Labetalol increased to 400mg  BID - new Rx e-prescribed. Pt advised to take Tylenol per package directions for H/A. She voiced understanding of all information and instructions given.

## 2017-10-14 ENCOUNTER — Inpatient Hospital Stay (HOSPITAL_COMMUNITY): Payer: Medicaid Other

## 2017-10-14 ENCOUNTER — Inpatient Hospital Stay (HOSPITAL_COMMUNITY)
Admission: AD | Admit: 2017-10-14 | Discharge: 2017-10-14 | Disposition: A | Discharge status: Home / Self Care | Payer: Medicaid Other | Source: Ambulatory Visit | Attending: Obstetrics and Gynecology | Admitting: Obstetrics and Gynecology

## 2017-10-14 ENCOUNTER — Encounter (HOSPITAL_COMMUNITY): Payer: Self-pay | Admitting: *Deleted

## 2017-10-14 DIAGNOSIS — O10011 Pre-existing essential hypertension complicating pregnancy, first trimester: Secondary | ICD-10-CM | POA: Diagnosis not present

## 2017-10-14 DIAGNOSIS — O200 Threatened abortion: Secondary | ICD-10-CM | POA: Insufficient documentation

## 2017-10-14 DIAGNOSIS — Z3A01 Less than 8 weeks gestation of pregnancy: Secondary | ICD-10-CM | POA: Diagnosis not present

## 2017-10-14 DIAGNOSIS — Z3491 Encounter for supervision of normal pregnancy, unspecified, first trimester: Secondary | ICD-10-CM

## 2017-10-14 DIAGNOSIS — O209 Hemorrhage in early pregnancy, unspecified: Secondary | ICD-10-CM

## 2017-10-14 LAB — CBC
HCT: 34.4 % — ABNORMAL LOW (ref 36.0–46.0)
Hemoglobin: 10.9 g/dL — ABNORMAL LOW (ref 12.0–15.0)
MCH: 25.5 pg — ABNORMAL LOW (ref 26.0–34.0)
MCHC: 31.7 g/dL (ref 30.0–36.0)
MCV: 80.4 fL (ref 78.0–100.0)
Platelets: 218 10*3/uL (ref 150–400)
RBC: 4.28 MIL/uL (ref 3.87–5.11)
RDW: 14.6 % (ref 11.5–15.5)
WBC: 7.6 10*3/uL (ref 4.0–10.5)

## 2017-10-14 LAB — URINALYSIS, ROUTINE W REFLEX MICROSCOPIC
Bilirubin Urine: NEGATIVE
Glucose, UA: NEGATIVE mg/dL
Ketones, ur: NEGATIVE mg/dL
Nitrite: NEGATIVE
Protein, ur: NEGATIVE mg/dL
Specific Gravity, Urine: 1.004 — ABNORMAL LOW (ref 1.005–1.030)
pH: 6 (ref 5.0–8.0)

## 2017-10-14 LAB — WET PREP, GENITAL
Sperm: NONE SEEN
Trich, Wet Prep: NONE SEEN
Yeast Wet Prep HPF POC: NONE SEEN

## 2017-10-14 LAB — POCT PREGNANCY, URINE: Preg Test, Ur: POSITIVE — AB

## 2017-10-14 LAB — HCG, QUANTITATIVE, PREGNANCY: hCG, Beta Chain, Quant, S: 5756 m[IU]/mL — ABNORMAL HIGH (ref <5)

## 2017-10-14 NOTE — Progress Notes (Signed)
Veronica Rogers CNM in earlier to discuss test results and d/c plan. Written and verbal d/c instructions given and understanding voiced. 

## 2017-10-14 NOTE — MAU Note (Signed)
Since 1600 when I go to [facility] and wipe I see pink on tissue. NO pain

## 2017-10-14 NOTE — MAU Provider Note (Signed)
Chief Complaint: Vaginal Bleeding   First Provider Initiated Contact with Patient 10/14/17 2109      SUBJECTIVE HPI: Shelley Young is a 37 y.o. [id number] at [redacted]w[redacted]d by LMP who presents to maternity admissions reporting vaginal bleeding. She reports vaginal bleeding started this afternoon around 1600. She reports dark red vaginal spotting when she wipes in the bathroom. She denies abdominal pain or cramping. She denies vaginal itching/burning, urinary symptoms, h/a, dizziness, n/v, or fever/chills. She has CHTN- taking labetalol 400mg  BID.   Past Medical History:  Diagnosis Date  . Hypertension   . Miscarriage    x6   Past Surgical History:  Procedure Laterality Date  . CESAREAN SECTION     Social History   Socioeconomic History  . Marital status: Divorced    Spouse name: Not on file  . Number of children: Not on file  . Years of education: Not on file  . Highest education level: Not on file  Occupational History  . Not on file  Social Needs  . Financial resource strain: Not on file  . Food insecurity:    Worry: Not on file    Inability: Not on file  . Transportation needs:    Medical: Not on file    Non-medical: Not on file  Tobacco Use  . Smoking status: Never Smoker  . Smokeless tobacco: Never Used  Substance and Sexual Activity  . Alcohol use: No  . Drug use: No  . Sexual activity: Yes    Birth control/protection: None  Lifestyle  . Physical activity:    Days per week: Not on file    Minutes per session: Not on file  . Stress: Not on file  Relationships  . Social connections:    Talks on phone: Not on file    Gets together: Not on file    Attends religious service: Not on file    Active member of club or organization: Not on file    Attends meetings of clubs or organizations: Not on file    Relationship status: Not on file  . Intimate partner violence:    Fear of current or ex partner: Not on file    Emotionally abused: Not on file    Physically abused:  Not on file    Forced sexual activity: Not on file  Other Topics Concern  . Not on file  Social History Narrative  . Not on file   No current facility-administered medications on file prior to encounter.    Current Outpatient Medications on File Prior to Encounter  Medication Sig Dispense Refill  . Prenatal Vit-Fe Fumarate-FA (PRENATAL MULTIVITAMIN) TABS tablet Take 1 tablet by mouth daily at 12 noon.     No Known Allergies  ROS:  Review of Systems  Constitutional: Negative.   Respiratory: Negative.   Cardiovascular: Negative.   Gastrointestinal: Negative.   Genitourinary: Positive for vaginal bleeding. Negative for difficulty urinating, dysuria, frequency, pelvic pain, urgency and vaginal discharge.  Musculoskeletal: Negative.    I have reviewed patient's Past Medical Hx, Surgical Hx, Family Hx, Social Hx, medications and allergies.   Physical Exam   Patient Vitals for the past 24 hrs:  BP Temp Pulse Resp Height Weight  10/14/17 2319 (!) 145/88 98.7 F (37.1 C) 85 18 - -  10/14/17 2056 137/81 - 86 - - -  10/14/17 2003 (!) 169/88 98.7 F (37.1 C) 83 18 5\' 1"  (1.549 m) 190 lb (86.2 kg)   Constitutional: Well-developed, well-nourished female in no acute  distress.  Cardiovascular: normal rate Respiratory: normal effort GI: Abd soft, non-tender. Pos BS x 4 MS: Extremities nontender, no edema, normal ROM Neurologic: Alert and oriented x 4.  GU: Neg CVAT.  PELVIC EXAM: Cervix pink, visually closed, without lesion, moderate dark red-brown vaginal bleeding, vaginal walls and external genitalia normal Bimanual exam: Cervix 0/long/high, firm, anterior, neg CMT, uterus nontender, nonenlarged, adnexa without tenderness, enlargement, or mass  LAB RESULTS Results for orders placed or performed during the hospital encounter of 10/14/17 (from the past 24 hour(s))  Urinalysis, Routine w reflex microscopic     Status: Abnormal   Collection Time: 10/14/17  8:10 PM  Result Value Ref  Range   Color, Urine YELLOW YELLOW   APPearance HAZY (A) CLEAR   Specific Gravity, Urine 1.004 (L) 1.005 - 1.030   pH 6.0 5.0 - 8.0   Glucose, UA NEGATIVE NEGATIVE mg/dL   Hgb urine dipstick LARGE (A) NEGATIVE   Bilirubin Urine NEGATIVE NEGATIVE   Ketones, ur NEGATIVE NEGATIVE mg/dL   Protein, ur NEGATIVE NEGATIVE mg/dL   Nitrite NEGATIVE NEGATIVE   Leukocytes, UA MODERATE (A) NEGATIVE   RBC / HPF 11-20 0 - 5 RBC/hpf   WBC, UA 21-50 0 - 5 WBC/hpf   Bacteria, UA MANY (A) NONE SEEN   Squamous Epithelial / LPF 0-5 0 - 5   Mucus PRESENT   Wet prep, genital     Status: Abnormal   Collection Time: 10/14/17  9:30 PM  Result Value Ref Range   Yeast Wet Prep HPF POC NONE SEEN NONE SEEN   Trich, Wet Prep NONE SEEN NONE SEEN   Clue Cells Wet Prep HPF POC PRESENT (A) NONE SEEN   WBC, Wet Prep HPF POC MANY (A) NONE SEEN   Sperm NONE SEEN   CBC     Status: Abnormal   Collection Time: 10/14/17  9:38 PM  Result Value Ref Range   WBC 7.6 4.0 - 10.5 K/uL   RBC 4.28 3.87 - 5.11 MIL/uL   Hemoglobin 10.9 (L) 12.0 - 15.0 g/dL   HCT 34.4 (L) 36.0 - 46.0 %   MCV 80.4 78.0 - 100.0 fL   MCH 25.5 (L) 26.0 - 34.0 pg   MCHC 31.7 30.0 - 36.0 g/dL   RDW 14.6 11.5 - 15.5 %   Platelets 218 150 - 400 K/uL  hCG, quantitative, pregnancy     Status: Abnormal   Collection Time: 10/14/17  9:38 PM  Result Value Ref Range   hCG, Beta Chain, Quant, S 5,756 (H) <5 mIU/mL  Pregnancy, urine POC     Status: Abnormal   Collection Time: 10/14/17 10:10 PM  Result Value Ref Range   Preg Test, Ur POSITIVE (A) NEGATIVE   IMAGING US Ob Less Than 14 Weeks With Ob Transvaginal  Result Date: 10/14/2017 CLINICAL DATA:  Pink spotting since 4 p.m. Quantitative beta HCG is pending. Estimated gestational age by LMP is 6 weeks 4 days. EXAM: OBSTETRIC <14 WK [place] AND TRANSVAGINAL OB US TECHNIQUE: Both transabdominal and transvaginal ultrasound examinations were performed for complete evaluation of the gestation as well as the  maternal uterus, adnexal regions, and pelvic cul-de-sac. Transvaginal technique was performed to assess early pregnancy. COMPARISON:  None. FINDINGS: Intrauterine gestational sac: A single intrauterine gestational sac is identified. Yolk sac:  The yolk sac is present. Embryo:  The fetal pole is present. Cardiac Activity: Fetal cardiac activity is not observed. CRL: 3.4 mm   5 w   6 d  [place] EDC: 06/11/2018 Subchorionic hemorrhage:  None visualized. Maternal uterus/adnexae: Uterus is anteverted. 1 cm calcification in the anterior myometrium likely representing a fibroid. Both ovaries are visualized and appear normal. No abnormal adnexal masses. No abnormal pelvic fluid collections. IMPRESSION: Single intrauterine pregnancy. Fetal pole is identified with crown-rump length measuring consistent with estimated gestational age of [redacted] weeks 6 days. No fetal cardiac activity is identified. Findings are suspicious but not yet definitive for failed pregnancy. Recommend follow-up US in 10-14 days for definitive diagnosis. This recommendation follows SRU consensus guidelines: Diagnostic Criteria for Nonviable Pregnancy Early in the First Trimester. [employer] Med 2013; 790:2409-73. Electronically Signed   By: Lucienne Capers M.D.   On: 10/14/2017 22:54    MAU Management/MDM: Orders Placed This Encounter  Procedures  . Wet prep, genital  . US OB LESS THAN 14 WEEKS WITH OB TRANSVAGINAL  . Urinalysis, Routine w reflex microscopic  . CBC  . hCG, quantitative, pregnancy  . Pregnancy, urine POC  . Discharge patient Discharge disposition: 01-Home or Self Care; Discharge patient date: 10/14/2017   Wet prep- positive for clue cells otherwise negative  CBC- WNL  ABO/Rh- A pos, no rhogam indicated for vaginal bleeding   Pt discharged with strict miscarriage precautions. Patient has extensive history of miscarriage. Discussed reasons to return to MAU for vaginal bleeding and/or abdominal pain. Educated on need  for f/u US in 10-14 days to assess viability- patient reports having an appointment in the office on 5/13. Pt discharged.   ASSESSMENT 1. Normal IUP (intrauterine pregnancy) on prenatal ultrasound, first trimester   2. Vaginal bleeding in pregnancy, first trimester   3. Threatened miscarriage in early pregnancy     PLAN Discharge home Follow up as scheduled on 5/13 in the office  Return to MAU as needed for vaginal bleeding, soaking more than one pad in an hour and/or abdominal pain   Follow-up [facility] for [facility] Follow up.   Specialty:  Obstetrics and Gynecology Why:  Follow up as scheduled for initial prenatal appointment  Contact information: [place] [phone]          Allergies as of 10/14/2017   No Known Allergies     Medication List    TAKE these medications   labetalol 200 MG tablet Commonly known as:  NORMODYNE Take 2 tablets (400 mg total) by mouth 2 (two) times daily.   prenatal multivitamin Tabs tablet Take 1 tablet by mouth daily at 12 noon.       Darrol Poke  Certified Nurse-Midwife 10/14/2017  11:06 PM

## 2017-10-16 ENCOUNTER — Encounter (HOSPITAL_COMMUNITY): Payer: Self-pay | Admitting: *Deleted

## 2017-10-16 ENCOUNTER — Inpatient Hospital Stay (HOSPITAL_COMMUNITY)
Admission: AD | Admit: 2017-10-16 | Discharge: 2017-10-16 | Disposition: A | Discharge status: Home / Self Care | Payer: Medicaid Other | Source: Ambulatory Visit | Attending: Obstetrics and Gynecology | Admitting: Obstetrics and Gynecology

## 2017-10-16 ENCOUNTER — Inpatient Hospital Stay (HOSPITAL_COMMUNITY): Payer: Medicaid Other

## 2017-10-16 ENCOUNTER — Inpatient Hospital Stay (EMERGENCY_DEPARTMENT_HOSPITAL)
Admission: AD | Admit: 2017-10-16 | Discharge: 2017-10-16 | Disposition: A | Discharge status: Home / Self Care | Payer: Medicaid Other | Source: Ambulatory Visit | Attending: Obstetrics & Gynecology | Admitting: Obstetrics & Gynecology

## 2017-10-16 DIAGNOSIS — O209 Hemorrhage in early pregnancy, unspecified: Secondary | ICD-10-CM | POA: Diagnosis not present

## 2017-10-16 DIAGNOSIS — Z3A01 Less than 8 weeks gestation of pregnancy: Secondary | ICD-10-CM | POA: Diagnosis not present

## 2017-10-16 DIAGNOSIS — O200 Threatened abortion: Secondary | ICD-10-CM | POA: Diagnosis not present

## 2017-10-16 LAB — GC/CHLAMYDIA PROBE AMP (~~LOC~~) NOT AT ARMC
Chlamydia: NEGATIVE
Neisseria Gonorrhea: NEGATIVE

## 2017-10-16 NOTE — Progress Notes (Signed)
I spent time with Shelley Young as she processed that she may be experiencing another miscarriage.  She has had 7 previous miscarriages as well as 2 premature babies who went through significant medical care.  Her children are now 72, 12 and 8 and are doing well, but the trauma and grief of what they went through and what she has been through are still present for her.  She is scared that she will lose this pregnancy as well and does not want to go through that again.  She prayed that she would not get pregnant again so that she would not suffer any more losses.  When she found out she was pregnant with this child, she accepted it, and is now painfully accepting that she may lose this one as well.  She stated that she cannot take birth control because of her blood pressure.  I brought this information to her provider to see if there may be other birth control options that she is not aware of.  She is also concerned about missing work this week if the bleeding continues and the pain increases.  I raised this also with her provider who will give her a letter in case she needs to miss work this week.    She stated that she became very depressed when she first found out that she was pregnant.  She denied any SI and said that she knows she needs to be strong for her children.  She is a very devoted mother and stated that they give her strength to go on.  She also has a good support in her SO.  She was very appreciative of talking and felt that it helped her release some of her emotions about this.    [place], Laurel Pager, [phone] 11:03 AM    10/16/17 1000  Clinical Encounter Type  Visited With Patient  Visit Type Spiritual support  Referral From Nurse  Spiritual Encounters  Spiritual Needs Emotional;Grief support

## 2017-10-16 NOTE — MAU Note (Signed)
Was here on Saturday, had [place], everything was ok.  After started having some spotting, this morning, it is a heavier flow.  Very mild cramping.

## 2017-10-16 NOTE — MAU Provider Note (Signed)
History     CSN: [id number]  Arrival date and time: 10/16/17 1647   First Provider Initiated Contact with Patient 10/16/17 2038      Chief Complaint  Patient presents with  . Abdominal Pain  . Vaginal Bleeding   HPI Shelley Young 37 y.o. [redacted]w[redacted]d  Client was seen in MAU and left earlier today.  She went home and was having much more pain at 2 pm today.  MAU was very busy and she waited for several hours in the waiting room.  Her pain has almost resolved.  She has not had much vaginal bleeding - very scant and light bleeding.     OB History    Gravida  11   Para  3   Term      Preterm  3   AB  7   Living  3     SAB  7   TAB      Ectopic      Multiple      Live Births  3           Past Medical History:  Diagnosis Date  . Hypertension   . Miscarriage    x6    Past Surgical History:  Procedure Laterality Date  . CESAREAN SECTION     X 3    History reviewed. No pertinent family history.  Social History   Tobacco Use  . Smoking status: Never Smoker  . Smokeless tobacco: Never Used  Substance Use Topics  . Alcohol use: No  . Drug use: No    Allergies: No Known Allergies  Medications Prior to Admission  Medication Sig Dispense Refill Last Dose  . labetalol (NORMODYNE) 200 MG tablet Take 2 tablets (400 mg total) by mouth 2 (two) times daily. 120 tablet 2 10/16/2017 at 1300  . Prenatal Vit-Fe Fumarate-FA (PRENATAL MULTIVITAMIN) TABS tablet Take 1 tablet by mouth daily at 12 noon.   10/16/2017 at Unknown time    Review of Systems  Constitutional: Negative for fever.  Gastrointestinal: Negative for nausea and vomiting.  Genitourinary: Negative for vaginal discharge.       Light vaginal bleeding Lower abdominal cramping earlier today.   Physical Exam   Blood pressure 140/81, pulse 82, temperature 98.2 F (36.8 C), temperature source Oral, resp. rate 18, weight 189 lb (85.7 kg), last menstrual period 08/29/2017, SpO2 100 %.  Physical Exam   Nursing note and vitals reviewed. Constitutional: She is oriented to person, place, and time. She appears well-developed and well-nourished.  HENT:  Head: Normocephalic.  Eyes: EOM are normal.  Neck: Neck supple.  GI: Soft. There is no tenderness. There is no rebound and no guarding.  Musculoskeletal: Normal range of motion.  Neurological: She is alert and oriented to person, place, and time.  Skin: Skin is warm and dry.  Psychiatric: She has a normal mood and affect.    MAU Course  Procedures  MDM Discussed with client the results of her 2 previous ultrasounds.  Since she has not had a FHT on either ultrasound and her bleeding is not heavy tonight, will not do an ultrasound today.  There is an order placed for [place] on Monday and advised her that [place] will call her.  Client is in agreement with this plan.  Assessment and Plan  Threatened Miscarriage  Plan Referred client to the discharge papers that she has from this morning. Continue to follow the instructions you were given earlier.  Torrey Horseman L Ahmaud Duthie 10/16/2017, 8:55  PM  

## 2017-10-16 NOTE — MAU Note (Signed)
Was here this morning for bleeding.  When she went home, the bleeding became heavier and the pain is worse.

## 2017-10-16 NOTE — Progress Notes (Signed)
I have reviewed the chart and agree with nursing staff's documentation of this patient's encounter.  Verita Schneiders, MD 10/16/2017 12:43 PM

## 2017-10-16 NOTE — MAU Provider Note (Signed)
Chief Complaint: Vaginal Bleeding and Abdominal Pain   First Provider Initiated Contact with Patient 10/16/17 0858        SUBJECTIVE HPI: Shelley Young is a 37 y.o. [id number] at [redacted]w[redacted]d by LMP who presents to maternity admissions reporting bleeding and cramping.  Heavier today than before.  Was seen on 10/14/17 for bleeding and US showed [redacted]w[redacted]d fetal pole with no heartbeat.  Patient seems to think they told her the baby was fine. . She denies vaginal itching/burning, urinary symptoms, h/a, dizziness, n/v, or fever/chills.    Vaginal Bleeding  The patient's primary symptoms include pelvic pain and vaginal bleeding. The patient's pertinent negatives include no genital itching, genital lesions or genital odor. The current episode started in the past 7 days. The problem occurs constantly. The problem has been unchanged. The pain is mild. She is pregnant. Associated symptoms include abdominal pain. Pertinent negatives include no back pain, chills, constipation, diarrhea or fever. The vaginal discharge was bloody. The vaginal bleeding is typical of menses. She has been passing clots. She has not been passing tissue. Nothing aggravates the symptoms. She has tried nothing for the symptoms.  Abdominal Pain  This is a new problem. The current episode started in the past 7 days. The onset quality is gradual. The problem occurs constantly. The problem has been unchanged. The pain is located in the suprapubic region. The pain is mild. The quality of the pain is cramping. The abdominal pain does not radiate. Pertinent negatives include no constipation, diarrhea or fever. Nothing aggravates the pain. The pain is relieved by nothing. She has tried nothing for the symptoms.   RN Note: Was here on Saturday, had [place], everything was ok.  After started having some spotting, this morning, it is a heavier flow.  Very mild cramping.   Past Medical History:  Diagnosis Date  . Hypertension   . Miscarriage    x6   Past  Surgical History:  Procedure Laterality Date  . CESAREAN SECTION     Social History   Socioeconomic History  . Marital status: Divorced    Spouse name: Not on file  . Number of children: Not on file  . Years of education: Not on file  . Highest education level: Not on file  Occupational History  . Not on file  Social Needs  . Financial resource strain: Not on file  . Food insecurity:    Worry: Not on file    Inability: Not on file  . Transportation needs:    Medical: Not on file    Non-medical: Not on file  Tobacco Use  . Smoking status: Never Smoker  . Smokeless tobacco: Never Used  Substance and Sexual Activity  . Alcohol use: No  . Drug use: No  . Sexual activity: Yes    Birth control/protection: None  Lifestyle  . Physical activity:    Days per week: Not on file    Minutes per session: Not on file  . Stress: Not on file  Relationships  . Social connections:    Talks on phone: Not on file    Gets together: Not on file    Attends religious service: Not on file    Active member of club or organization: Not on file    Attends meetings of clubs or organizations: Not on file    Relationship status: Not on file  . Intimate partner violence:    Fear of current or ex partner: Not on file    Emotionally abused:  Not on file    Physically abused: Not on file    Forced sexual activity: Not on file  Other Topics Concern  . Not on file  Social History Narrative  . Not on file   No current facility-administered medications on file prior to encounter.    Current Outpatient Medications on File Prior to Encounter  Medication Sig Dispense Refill  . labetalol (NORMODYNE) 200 MG tablet Take 2 tablets (400 mg total) by mouth 2 (two) times daily. 120 tablet 2  . Prenatal Vit-Fe Fumarate-FA (PRENATAL MULTIVITAMIN) TABS tablet Take 1 tablet by mouth daily at 12 noon.     No Known Allergies  I have reviewed patient's Past Medical Hx, Surgical Hx, Family Hx, Social Hx,  medications and allergies.   ROS:  Review of Systems  Constitutional: Negative for chills and fever.  Gastrointestinal: Positive for abdominal pain. Negative for constipation and diarrhea.  Genitourinary: Positive for pelvic pain and vaginal bleeding.  Musculoskeletal: Negative for back pain.   Review of Systems  Other systems negative   Physical Exam  Physical Exam Patient Vitals for the past 24 hrs:  BP Temp Temp src Pulse Resp Weight  10/16/17 0847 (!) 152/84 98.7 F (37.1 C) Oral 83 16 -  10/16/17 0840 - - - - - 188 lb 12 oz (85.6 kg)   Constitutional: Well-developed, well-nourished female in no acute distress.  Cardiovascular: normal rate Respiratory: normal effort GI: Abd soft, non-tender. Pos BS x 4 MS: Extremities nontender, no edema, normal ROM Neurologic: Alert and oriented x 4.  GU: Neg CVAT.  PELVIC EXAM: Cervix pink, visually closed, without lesion, Moderate clotted blood in vault.   vaginal walls and external genitalia normal Bimanual exam: Cervix 0/long/high, firm, anterior, neg CMT, uterus nontender, nonenlarged, adnexa without tenderness, enlargement, or mass   LAB RESULTS No results found for this or any previous visit (from the past 24 hour(s)).     IMAGING US Ob Transvaginal  Result Date: 10/16/2017 CLINICAL DATA:  Heavy vaginal bleeding, clots EXAM: TRANSVAGINAL OB ULTRASOUND TECHNIQUE: Transvaginal ultrasound was performed for complete evaluation of the gestation as well as the maternal uterus, adnexal regions, and pelvic cul-de-sac. COMPARISON:  10/14/2017 FINDINGS: Intrauterine gestational sac: Visualized, elongated and irregularly shaped. Yolk sac:  Not visualized Embryo:  Visualized Cardiac Activity: Not visualized Heart Rate:  bpm MSD:   mm    w     d CRL:   3.0 mm   5 w 5 d                  [place] EDC: 06/13/2018 Subchorionic hemorrhage:  New small subchorionic hemorrhage Maternal uterus/adnexae: No adnexal masses or free fluid. IMPRESSION: Five week 5  day intrauterine pregnancy. This previously measured 5 weeks 6 days 2 days ago. Gestational sac is somewhat elongated and irregular. No fetal heart tones detectable. Recommend continued close follow-up. Repeat ultrasound in 10-14 days could be performed to assess progression. New small subchorionic hemorrhage. Electronically Signed   By: Rolm Baptise M.D.   On: 10/16/2017 10:01   US Ob Less Than 14 Weeks With Ob Transvaginal  Result Date: 10/14/2017 CLINICAL DATA:  Pink spotting since 4 p.m. Quantitative beta HCG is pending. Estimated gestational age by LMP is 6 weeks 4 days. EXAM: OBSTETRIC <14 WK [place] AND TRANSVAGINAL OB US TECHNIQUE: Both transabdominal and transvaginal ultrasound examinations were performed for complete evaluation of the gestation as well as the maternal uterus, adnexal regions, and pelvic cul-de-sac. Transvaginal technique was performed to  assess early pregnancy. COMPARISON:  None. FINDINGS: Intrauterine gestational sac: A single intrauterine gestational sac is identified. Yolk sac:  The yolk sac is present. Embryo:  The fetal pole is present. Cardiac Activity: Fetal cardiac activity is not observed. CRL: 3.4 mm   5 w   6 d                  [place] EDC: 06/11/2018 Subchorionic hemorrhage:  None visualized. Maternal uterus/adnexae: Uterus is anteverted. 1 cm calcification in the anterior myometrium likely representing a fibroid. Both ovaries are visualized and appear normal. No abnormal adnexal masses. No abnormal pelvic fluid collections. IMPRESSION: Single intrauterine pregnancy. Fetal pole is identified with crown-rump length measuring consistent with estimated gestational age of [redacted] weeks 6 days. No fetal cardiac activity is identified. Findings are suspicious but not yet definitive for failed pregnancy. Recommend follow-up US in 10-14 days for definitive diagnosis. This recommendation follows SRU consensus guidelines: Diagnostic Criteria for Nonviable Pregnancy Early in the First Trimester. [employer] Med 2013; 094:0768-08. Electronically Signed   By: Lucienne Capers M.D.   On: 10/14/2017 22:54     MAU Management/MDM: Ordered followup US (labs already done last visit)  This bleeding/pain can represent a normal pregnancy with bleeding, spontaneous abortion or even an ectopic which can be life-threatening.  The process as listed above helps to determine which of these is present.  US showed elongated sac, + fetal pole and no heartbeat.  ASSESSMENT Single intrauterine pregnancy at [redacted]w[redacted]d Threatened abortion  PLAN Discharge home Discussed this is probably a miscarriage, but Radiologist won't confirm yet.  Discussed bleeding precautions. Plan to repeat Ultrasound in about 7-10 days SAB precautions   Pt stable at time of discharge. Encouraged to return here or to other Urgent Care/ED if she develops worsening of symptoms, increase in pain, fever, or other concerning symptoms.    Hansel Feinstein CNM, MSN Certified Nurse-Midwife 10/16/2017  8:58 AM

## 2017-10-16 NOTE — MAU Note (Signed)
Chaplain in with pt.

## 2017-10-16 NOTE — Discharge Instructions (Signed)
Threatened Miscarriage °A threatened miscarriage is when you have vaginal bleeding during your first 20 weeks of pregnancy but the pregnancy has not ended. Your doctor will do tests to make sure you are still pregnant. The cause of the bleeding may not be known. This condition does not mean your pregnancy will end. It does increase the risk of it ending (complete miscarriage). °Follow these instructions at home: °· Make sure you keep all your doctor visits for prenatal care. °· Get plenty of rest. °· Do not have sex or use tampons if you have vaginal bleeding. °· Do not douche. °· Do not smoke or use drugs. °· Do not drink alcohol. °· Avoid caffeine. °Contact a doctor if: °· You have light bleeding from your vagina. °· You have belly pain or cramping. °· You have a fever. °Get help right away if: °· You have heavy bleeding from your vagina. °· You have clots of blood coming from your vagina. °· You have bad pain or cramps in your low back or belly. °· You have fever, chills, and bad belly pain. °This information is not intended to replace advice given to you by your health care provider. Make sure you discuss any questions you have with your health care provider. °Document Released: 05/19/2008 Document Revised: 11/12/2015 Document Reviewed: 04/02/2013 °Elsevier Interactive Patient Education © 2018 Elsevier Inc. ° °

## 2017-10-20 ENCOUNTER — Ambulatory Visit (INDEPENDENT_AMBULATORY_CARE_PROVIDER_SITE_OTHER): Payer: Medicaid Other | Admitting: Student

## 2017-10-20 ENCOUNTER — Telehealth: Payer: Self-pay | Admitting: *Deleted

## 2017-10-20 ENCOUNTER — Ambulatory Visit (HOSPITAL_COMMUNITY)
Admission: RE | Admit: 2017-10-20 | Discharge: 2017-10-20 | Disposition: A | Discharge status: Home / Self Care | Payer: Medicaid Other | Source: Ambulatory Visit | Attending: Advanced Practice Midwife | Admitting: Advanced Practice Midwife

## 2017-10-20 ENCOUNTER — Encounter: Payer: Self-pay | Admitting: Student

## 2017-10-20 VITALS — BP 160/94 | HR 88 | Ht 61.0 in

## 2017-10-20 DIAGNOSIS — O209 Hemorrhage in early pregnancy, unspecified: Secondary | ICD-10-CM

## 2017-10-20 DIAGNOSIS — O039 Complete or unspecified spontaneous abortion without complication: Secondary | ICD-10-CM | POA: Diagnosis not present

## 2017-10-20 DIAGNOSIS — Z3687 Encounter for antenatal screening for uncertain dates: Secondary | ICD-10-CM | POA: Diagnosis not present

## 2017-10-20 DIAGNOSIS — O200 Threatened abortion: Secondary | ICD-10-CM

## 2017-10-20 NOTE — Telephone Encounter (Signed)
Pt. Called 10/18/17 11:50 and left a voicemail stating she went to ER Monday and was supposed to be scheduled for [place] but didn't get a call.

## 2017-10-20 NOTE — Patient Instructions (Signed)

## 2017-10-21 DIAGNOSIS — O039 Complete or unspecified spontaneous abortion without complication: Secondary | ICD-10-CM | POA: Insufficient documentation

## 2017-10-21 LAB — BETA HCG QUANT (REF LAB): HCG QUANT: 125 m[IU]/mL

## 2017-10-21 NOTE — Progress Notes (Signed)
  Subjective:     Patient ID: Shelley Young, female   DOB: Jan 29, 1981, 37 y.o.   MRN: [id number]  HPI Patient Shelley Young is a 37 y.o. [id number] here for follow-up US after being diagnosed with threatened miscarriage on 4-29. PAtient's bleeding has tapered off; she has some light cramping today but overall feels that miscarriage is finished.   [place] today shows uterus is empty, previously seen YS not visualized. Adnexa and ovaries normal.   Review of Systems  Constitutional: Negative.   HENT: Negative.   Respiratory: Negative.   Cardiovascular: Negative.   Genitourinary: Positive for vaginal bleeding.  Musculoskeletal: Negative.   Neurological: Negative.   Hematological: Negative.   Psychiatric/Behavioral: Negative.        Objective:   Physical Exam  Constitutional: She is oriented to person, place, and time. She appears well-developed.  HENT:  Head: Normocephalic.  Eyes: Pupils are equal, round, and reactive to light.  Cardiovascular: Normal rate.  Pulmonary/Chest: Effort normal.  Abdominal: Soft.  Neurological: She is alert and oriented to person, place, and time.  Skin: Skin is warm.       Assessment:     1. Miscarriage    2. Patient is tearful; would like to make appt with [facility] for next week.  Supportive listening and coping mechanisms discussed.  3. Will consider her options for birth control.     Plan:     1. Repeat beta today in order to confirm dropping levels; non-stat beta shows that beta is dropped to 125. Will call patient and ask her to come on Friday for repeat non-stat beta.  2. Patient will make appointment with Roselyn Reef for next week, and follow up with a provider in two weeks.   3. Bleeding and return precautions reviewed. All questions answered; patient is stable to return home.

## 2017-10-21 NOTE — Progress Notes (Deleted)
Patient ID: Shelley Young, female   DOB: 02-22-1981, 37 y.o.   MRN: [id number]

## 2017-10-21 NOTE — Progress Notes (Deleted)
Patient ID: Shelley Young, female   DOB: 1981-01-01, 37 y.o.   MRN: [id number]

## 2017-10-23 ENCOUNTER — Other Ambulatory Visit: Payer: Medicaid Other

## 2017-10-23 DIAGNOSIS — Z029 Encounter for administrative examinations, unspecified: Secondary | ICD-10-CM

## 2017-10-23 DIAGNOSIS — O039 Complete or unspecified spontaneous abortion without complication: Secondary | ICD-10-CM

## 2017-10-24 ENCOUNTER — Telehealth: Payer: Self-pay | Admitting: Student

## 2017-10-24 LAB — BETA HCG QUANT (REF LAB): HCG QUANT: 46 m[IU]/mL

## 2017-10-24 NOTE — Telephone Encounter (Signed)
Called patient and reviewed beta results with her. Will repeat beta at her follow-up appt on 10-30-2017; patient verbalized understanding. Will return to [facility] if she has any more bleeding or abdominal pain.  Maye Hides

## 2017-10-24 NOTE — Telephone Encounter (Signed)
Left message for patient and told her that we needed her to come in on Friday 10/27/2017 for a repeat bhcg. Will try to call again tomorrow.  Maye Hides

## 2017-10-24 NOTE — Telephone Encounter (Signed)
Per chart review had [place] 10/20/17 and seen in our office.

## 2017-10-30 ENCOUNTER — Encounter: Payer: Self-pay | Admitting: Obstetrics and Gynecology

## 2017-10-30 ENCOUNTER — Other Ambulatory Visit (HOSPITAL_COMMUNITY)
Admission: RE | Admit: 2017-10-30 | Discharge: 2017-10-30 | Disposition: A | Discharge status: Home / Self Care | Payer: Medicaid Other | Source: Ambulatory Visit | Attending: Obstetrics and Gynecology | Admitting: Obstetrics and Gynecology

## 2017-10-30 ENCOUNTER — Ambulatory Visit: Payer: Medicaid Other | Admitting: Obstetrics and Gynecology

## 2017-10-30 ENCOUNTER — Ambulatory Visit (INDEPENDENT_AMBULATORY_CARE_PROVIDER_SITE_OTHER): Payer: Medicaid Other | Admitting: Clinical

## 2017-10-30 VITALS — BP 170/98 | HR 86 | Wt 187.7 lb

## 2017-10-30 DIAGNOSIS — F4321 Adjustment disorder with depressed mood: Secondary | ICD-10-CM | POA: Insufficient documentation

## 2017-10-30 DIAGNOSIS — I10 Essential (primary) hypertension: Secondary | ICD-10-CM | POA: Diagnosis not present

## 2017-10-30 DIAGNOSIS — F32A Depression, unspecified: Secondary | ICD-10-CM

## 2017-10-30 DIAGNOSIS — Z30430 Encounter for insertion of intrauterine contraceptive device: Secondary | ICD-10-CM | POA: Diagnosis not present

## 2017-10-30 DIAGNOSIS — F329 Major depressive disorder, single episode, unspecified: Secondary | ICD-10-CM | POA: Insufficient documentation

## 2017-10-30 LAB — POCT PREGNANCY, URINE: Preg Test, Ur: NEGATIVE

## 2017-10-30 MED ORDER — MISOPROSTOL 200 MCG PO TABS: 200.0000 ug | ORAL_TABLET | Freq: Once | ORAL | 0 refills | Status: DC

## 2017-10-30 MED ORDER — AMLODIPINE BESYLATE 10 MG PO TABS: 10.0000 mg | ORAL_TABLET | Freq: Every day | ORAL | 1 refills | Status: DC

## 2017-10-30 NOTE — BH Specialist Note (Signed)
Integrated Behavioral Health Initial Visit  MRN: [id number] Name: Bayli Quesinberry  Number of [place] Clinician visits:: 1/6 Session Start time: 4:12 Session End time: 4:38 Total time: 30 minutes  Type of Service: [place] Interpretor:No. Interpretor Name and Language: n/a   Warm Hand Off Completed.       SUBJECTIVE: Nakshatra Klose is a 37 y.o. female accompanied by n/a Patient was referred by Dr Ilda Basset for grief after miscarriage. Patient reports the following symptoms/concerns: Pt states her primary concern today is feeling sad, tired, restless, difficulty sleeping and blaming herself after her last miscarriage; talking about her feelings today helps, and she is open to learn self-coping strategies to improve sleep.  Duration of problem: Two weeks; Severity of problem: moderate  OBJECTIVE: Mood: Anxious and Depressed and Affect: Appropriate and Tearful Risk of harm to self or others: No plan to harm self or others  LIFE CONTEXT: Family and Social: Pt lives with her three sons (2yo, 39yo, 61yo) School/Work: - Self-Care: Recognizing greater need for self-care Life Changes: Recent miscarriage(8th in total)  GOALS ADDRESSED: Patient will: 1. Reduce symptoms of: anxiety, depression and insomnia 2. Increase knowledge and/or ability of: self-management skills  3. Demonstrate ability to: Increase healthy adjustment to current life circumstances, Increase adequate support systems for patient/family and Begin healthy grieving over loss  INTERVENTIONS: Interventions utilized: Mindfulness or Psychologist, educational, Sleep Hygiene, Psychoeducation and/or Health Education and Link to [employer]  Standardized Assessments completed: GAD-7 and PHQ 9  ASSESSMENT: Patient currently experiencing Grief.    Patient may benefit from psychoeducation and brief therapeutic interventions regarding coping with symptoms of  depression and anxiety related to grief .  PLAN: 1. Follow up with behavioral health clinician on : As requested by pt 2. Behavioral recommendations:  -CALM relaxation breathing exercise and sleep app at bedtime; continue for as long as remains helpful -Consider contacting [facility] grief support -Read educational materials regarding coping with symptoms of depression and anxiety related to current grief  3. Referral(s): [place] (In Clinic) and [employer] Resources:  [facility] grief 4. "From scale of 1-10, how likely are you to follow plan?": 9  Caroleen Hamman McMannes, LCSW  Depression screen [facility] 2/9 10/30/2017  Decreased Interest 0  Down, Depressed, Hopeless 0  PHQ - 2 Score 0  Altered sleeping 0  Tired, decreased energy 0  Change in appetite 0  Feeling bad or failure about yourself  0  Trouble concentrating 0  Moving slowly or fidgety/restless 0  Suicidal thoughts 0  PHQ-9 Score 0   GAD 7 : Generalized Anxiety Score 10/30/2017  Nervous, Anxious, on Edge 0  Control/stop worrying 0  Worry too much - different things 0  Trouble relaxing 0  Restless 0  Easily annoyed or irritable 0  Afraid - awful might happen 0  Total GAD 7 Score 0

## 2017-10-30 NOTE — Procedures (Signed)
Intrauterine Device (IUD) Attempted Insertion Procedure Note  Written consent was obtained; her urine pregnancy test was: negative The patient understands the risks of IUD placement, which include but are not limited to: bleeding, infection, uterine perforation, risk of expulsion, risk of failure < 1%, increased risk of ectopic pregnancy in the event of failure.   Prior to the procedure being performed, the patient (or guardian) was asked to state their full name, date of birth, and the type of procedure being performed. A bimanual exam showed the uterus to be anteverted.  Next, the graves speculum was inserted and the cervix visualized and a pap smear done. The cervix and vagina were cleaned with an antiseptic solution, and the cervix was grasped with a tenaculum.  The uterus only sounded to 4 cm. Os finders and dilators were attempted but the patient was too uncomfortable so the procedure was stopped. The tenaculum was removed and cervix was found to be hemostatic.    Durene Romans MD Attending Center for [employer] [facility])

## 2017-10-30 NOTE — Progress Notes (Signed)
Obstetrics and Gynecology Visit Return Patient Evaluation  Appointment Date: 10/30/2017  Primary Care Provider: [facility] per patient  Referring Provider: No ref. provider found  Chief Complaint: f/u SAB, contraception management  History of Present Illness:  Shelley Young is a 37 y.o. s/p SAB late April/early May. No bleeding or spotting since then and doesn't feel like she's had a period. Patient states periods were qmonth and regular before then. She decides that she'd like to do paragard again; she had it before for about 2 years and had it removed when she wanted to conceive again a few years ago.   Patient states she is taking the labetalol 400 bid and denies any ha, chest pain, sob, visual changes.   Review of Systems: as noted in the History of Present Illness.  Medications: labetalol 400 bid, PNV Allergies: has No Known Allergies.  Physical Exam:  BP (!) 170/98 (BP Location: Left Arm, Patient Position: Sitting, Cuff Size: Large)   Pulse 86   Wt 187 lb 11.2 oz (85.1 kg)   LMP 08/29/2017   Breastfeeding? No   BMI 35.47 kg/m  Body mass index is 35.47 kg/m. General appearance: Well nourished, well developed female in no acute distress.  Abdomen: diffusely non tender to palpation, non distended, and no masses, hernias Neuro/Psych:  Normal mood and affect.    Pelvic exam:  EGBUS normal Vaginal vault normal Cervix: nulliparous, nttp  See procedure note for attempted paragard iud insertion.    Assessment: pt stable  Plan:  1. Essential hypertension Basic labs. Recommend patient call [facility] to re-established primary care and will d/c labetalol and start norvasc 10mg  qday since no pregnant anymore - Basic metabolic panel - Magnesium - TSH - CBC  2. Encounter for insertion of copper intrauterine contraceptive device (IUD)-attempted Unable to place due to tight internal os and patient discomfort. Pap smear done and d/w her re: other options, such as the nexplanon. RTC  2wks for bp check and to follow up [facility] options with her. I told her I don't recommend any estrogen containing options - Beta hCG quant (ref lab) - Cytology - PAP   RTC: 2wks  Durene Romans MD Attending Center for [facility])

## 2017-10-31 LAB — BASIC METABOLIC PANEL
BUN/Creatinine Ratio: 18 (ref 9–23)
BUN: 15 mg/dL (ref 6–20)
CALCIUM: 9.7 mg/dL (ref 8.7–10.2)
CO2: 25 mmol/L (ref 20–29)
Chloride: 103 mmol/L (ref 96–106)
Creatinine, Ser: 0.85 mg/dL (ref 0.57–1.00)
GFR calc Af Amer: 101 mL/min/{1.73_m2} (ref >59)
GFR, EST NON AFRICAN AMERICAN: 87 mL/min/{1.73_m2} (ref >59)
GLUCOSE: 78 mg/dL (ref 65–99)
Potassium: 4.5 mmol/L (ref 3.5–5.2)
Sodium: 140 mmol/L (ref 134–144)

## 2017-10-31 LAB — CBC
HEMOGLOBIN: 11.3 g/dL (ref 11.1–15.9)
Hematocrit: 35 % (ref 34.0–46.6)
MCH: 25.1 pg — ABNORMAL LOW (ref 26.6–33.0)
MCHC: 32.3 g/dL (ref 31.5–35.7)
MCV: 78 fL — ABNORMAL LOW (ref 79–97)
Platelets: 310 10*3/uL (ref 150–379)
RBC: 4.5 x10E6/uL (ref 3.77–5.28)
RDW: 14.9 % (ref 12.3–15.4)
WBC: 6.5 10*3/uL (ref 3.4–10.8)

## 2017-10-31 LAB — MAGNESIUM: MAGNESIUM: 2 mg/dL (ref 1.6–2.3)

## 2017-10-31 LAB — BETA HCG QUANT (REF LAB): hCG Quant: 5 m[IU]/mL

## 2017-10-31 LAB — TSH: TSH: 0.934 u[IU]/mL (ref 0.450–4.500)

## 2017-11-01 ENCOUNTER — Telehealth: Payer: Self-pay | Admitting: Student

## 2017-11-01 LAB — CYTOLOGY - PAP
CHLAMYDIA, DNA PROBE: NEGATIVE
Diagnosis: UNDETERMINED — AB
HPV (WINDOPATH): NOT DETECTED
NEISSERIA GONORRHEA: NEGATIVE
Trichomonas: NEGATIVE

## 2017-11-01 NOTE — Telephone Encounter (Signed)
Encounter created in error

## 2017-11-06 ENCOUNTER — Encounter: Payer: Self-pay | Admitting: *Deleted

## 2017-11-06 ENCOUNTER — Ambulatory Visit: Payer: Self-pay

## 2017-11-10 ENCOUNTER — Ambulatory Visit (INDEPENDENT_AMBULATORY_CARE_PROVIDER_SITE_OTHER): Payer: Medicaid Other | Admitting: *Deleted

## 2017-11-10 VITALS — BP 146/85 | HR 91 | Wt 183.3 lb

## 2017-11-10 DIAGNOSIS — Z3202 Encounter for pregnancy test, result negative: Secondary | ICD-10-CM

## 2017-11-10 DIAGNOSIS — Z3042 Encounter for surveillance of injectable contraceptive: Secondary | ICD-10-CM | POA: Diagnosis not present

## 2017-11-10 LAB — POCT PREGNANCY, URINE: PREG TEST UR: NEGATIVE

## 2017-11-10 MED ORDER — MEDROXYPROGESTERONE ACETATE 150 MG/ML IM SUSP
150.0000 mg | Freq: Once | INTRAMUSCULAR | Status: AC
  Administered 2017-11-10: 150 mg via INTRAMUSCULAR

## 2017-11-10 NOTE — Progress Notes (Signed)
Pt here to initiate birth control after SAB. Following visit on 5/13 w/Dr. Ilda Basset, pt decided she would like Depo Provera injections. Her last intercourse was 1 week ago - protected with condom. Negative UPT obtained today. Pt states she is feeling well and will schedule appt w/PCP for follow up of hypertension.  Depo Provera 150 mg IM administered. Pt tolerated well. Next injection due 8/9-8/23.

## 2017-11-10 NOTE — Progress Notes (Signed)
Chart reviewed for nurse visit. Agree with plan of care.   Katheren Shams, DO 11/10/2017 5:06 PM

## 2018-01-26 ENCOUNTER — Ambulatory Visit: Payer: Self-pay

## 2018-01-29 ENCOUNTER — Ambulatory Visit: Payer: Self-pay

## 2018-10-26 ENCOUNTER — Encounter (HOSPITAL_COMMUNITY): Payer: Self-pay | Admitting: *Deleted

## 2018-10-26 ENCOUNTER — Other Ambulatory Visit: Payer: Self-pay

## 2018-10-26 ENCOUNTER — Inpatient Hospital Stay (HOSPITAL_COMMUNITY)
Admission: AD | Admit: 2018-10-26 | Discharge: 2018-10-26 | Disposition: A | Discharge status: Home / Self Care | Payer: Medicaid Other | Attending: Obstetrics & Gynecology | Admitting: Obstetrics & Gynecology

## 2018-10-26 DIAGNOSIS — I10 Essential (primary) hypertension: Secondary | ICD-10-CM | POA: Diagnosis present

## 2018-10-26 DIAGNOSIS — Z3A01 Less than 8 weeks gestation of pregnancy: Secondary | ICD-10-CM

## 2018-10-26 DIAGNOSIS — O10911 Unspecified pre-existing hypertension complicating pregnancy, first trimester: Secondary | ICD-10-CM | POA: Diagnosis not present

## 2018-10-26 DIAGNOSIS — O161 Unspecified maternal hypertension, first trimester: Secondary | ICD-10-CM | POA: Insufficient documentation

## 2018-10-26 DIAGNOSIS — O34219 Maternal care for unspecified type scar from previous cesarean delivery: Secondary | ICD-10-CM | POA: Diagnosis not present

## 2018-10-26 DIAGNOSIS — O09521 Supervision of elderly multigravida, first trimester: Secondary | ICD-10-CM | POA: Insufficient documentation

## 2018-10-26 DIAGNOSIS — O10919 Unspecified pre-existing hypertension complicating pregnancy, unspecified trimester: Secondary | ICD-10-CM

## 2018-10-26 LAB — URINALYSIS, ROUTINE W REFLEX MICROSCOPIC
Bilirubin Urine: NEGATIVE
Glucose, UA: NEGATIVE mg/dL
Ketones, ur: NEGATIVE mg/dL
Leukocytes,Ua: NEGATIVE
Nitrite: NEGATIVE
Protein, ur: NEGATIVE mg/dL
Specific Gravity, Urine: 1.02 (ref 1.005–1.030)
pH: 6 (ref 5.0–8.0)

## 2018-10-26 LAB — PROTEIN / CREATININE RATIO, URINE
Creatinine, Urine: 150.43 mg/dL
Protein Creatinine Ratio: 0.07 mg/mg{Cre} (ref 0.00–0.15)
Total Protein, Urine: 11 mg/dL

## 2018-10-26 LAB — COMPREHENSIVE METABOLIC PANEL
ALT: 67 U/L — ABNORMAL HIGH (ref 0–44)
AST: 27 U/L (ref 15–41)
Albumin: 4.1 g/dL (ref 3.5–5.0)
Alkaline Phosphatase: 91 U/L (ref 38–126)
Anion gap: 12 (ref 5–15)
BUN: 15 mg/dL (ref 6–20)
CO2: 26 mmol/L (ref 22–32)
Calcium: 9.8 mg/dL (ref 8.9–10.3)
Chloride: 100 mmol/L (ref 98–111)
Creatinine, Ser: 0.79 mg/dL (ref 0.44–1.00)
GFR calc Af Amer: >60 mL/min (ref >60)
GFR calc non Af Amer: >60 mL/min (ref >60)
Glucose, Bld: 89 mg/dL (ref 70–99)
Potassium: 3.6 mmol/L (ref 3.5–5.1)
Sodium: 138 mmol/L (ref 135–145)
Total Bilirubin: 0.7 mg/dL (ref 0.3–1.2)
Total Protein: 8.1 g/dL (ref 6.5–8.1)

## 2018-10-26 LAB — CBC
HCT: 39.1 % (ref 36.0–46.0)
Hemoglobin: 12.2 g/dL (ref 12.0–15.0)
MCH: 24.8 pg — ABNORMAL LOW (ref 26.0–34.0)
MCHC: 31.2 g/dL (ref 30.0–36.0)
MCV: 79.6 fL — ABNORMAL LOW (ref 80.0–100.0)
Platelets: 267 10*3/uL (ref 150–400)
RBC: 4.91 MIL/uL (ref 3.87–5.11)
RDW: 14.2 % (ref 11.5–15.5)
WBC: 6.1 10*3/uL (ref 4.0–10.5)
nRBC: 0 % (ref 0.0–0.2)

## 2018-10-26 MED ORDER — LABETALOL HCL 200 MG PO TABS: 200.0000 mg | ORAL_TABLET | Freq: Two times a day (BID) | ORAL | 0 refills | Status: DC

## 2018-10-26 MED ORDER — LABETALOL HCL 100 MG PO TABS
200.0000 mg | ORAL_TABLET | Freq: Once | ORAL | Status: AC
  Administered 2018-10-26: 11:00:00 200 mg via ORAL
  Filled 2018-10-26: qty 2

## 2018-10-26 NOTE — Discharge Instructions (Signed)
Hypertension During Pregnancy ° °Hypertension, commonly called high blood pressure, is when the force of blood pumping through your arteries is too strong. Arteries are blood vessels that carry blood from the heart throughout the body. Hypertension during pregnancy can cause problems for you and your baby. Your baby may be born early (prematurely) or may not weigh as much as he or she should at birth. Very bad cases of hypertension during pregnancy can be life-threatening. °Different types of hypertension can occur during pregnancy. These include: °· Chronic hypertension. This happens when: °? You have hypertension before pregnancy and it continues during pregnancy. °? You develop hypertension before you are [redacted] weeks pregnant, and it continues during pregnancy. °· Gestational hypertension. This is hypertension that develops after the 20th week of pregnancy. °· Preeclampsia, also called toxemia of pregnancy. This is a very serious type of hypertension that develops during pregnancy. It can be very dangerous for you and your baby. °? In rare cases, you may develop preeclampsia after giving birth (postpartum preeclampsia). This usually occurs within 48 hours after childbirth but may occur up to 6 weeks after giving birth. °Gestational hypertension and preeclampsia usually go away within 6 weeks after your baby is born. Women who have hypertension during pregnancy have a greater chance of developing hypertension later in life or during future pregnancies. °What are the causes? °The exact cause of hypertension during pregnancy is not known. °What increases the risk? °There are certain factors that make it more likely for you to develop hypertension during pregnancy. These include: °· Having hypertension during a previous pregnancy or prior to pregnancy. °· Being overweight. °· Being age 35 or older. °· Being pregnant for the first time. °· Being pregnant with more than one baby. °· Becoming pregnant using fertilization  methods such as IVF (in vitro fertilization). °· Having diabetes, kidney problems, or systemic lupus erythematosus. °· Having a family history of hypertension. °What are the signs or symptoms? °Chronic hypertension and gestational hypertension rarely cause symptoms. Preeclampsia causes symptoms, which may include: °· Increased protein in your urine. Your health care provider will check for this at every visit before you give birth (prenatal visit). °· Severe headaches. °· Sudden weight gain. °· Swelling of the hands, face, legs, and feet. °· Nausea and vomiting. °· Vision problems, such as blurred or double vision. °· Numbness in the face, arms, legs, and feet. °· Dizziness. °· Slurred speech. °· Sensitivity to bright lights. °· Abdominal pain. °· Convulsions or seizures. °How is this diagnosed? °You may be diagnosed with hypertension during a routine prenatal exam. At each prenatal visit, you may: °· Have a urine test to check for high amounts of protein in your urine. °· Have your blood pressure checked. A blood pressure reading is given as two numbers, such as "120 over 80" (or 120/80). The first ("top") number is a measure of the pressure in your arteries when your heart beats (systolic pressure). The second ("bottom") number is a measure of the pressure in your arteries as your heart relaxes between beats (diastolic pressure). Blood pressure is measured in a unit called mm Hg. For most women, a normal blood pressure reading is: °? Systolic: below 120. °? Diastolic: below 80. °The type of hypertension that you are diagnosed with depends on your test results and when your symptoms developed. °· Chronic hypertension is usually diagnosed before 20 weeks of pregnancy. °· Gestational hypertension is usually diagnosed after 20 weeks of pregnancy. °· Hypertension with high amounts of protein in   the urine is diagnosed as preeclampsia. °· Blood pressure measurements that stay above 160 systolic, or above 110 diastolic,  are signs of severe preeclampsia. °How is this treated? °Treatment for hypertension during pregnancy varies depending on the type of hypertension you have and how serious it is. °· If you take medicines called ACE inhibitors to treat chronic hypertension, you may need to switch medicines. ACE inhibitors should not be taken during pregnancy. °· If you have gestational hypertension, you may need to take blood pressure medicine. °· If you are at risk for preeclampsia, your health care provider may recommend that you take a low-dose aspirin during your pregnancy. °· If you have severe preeclampsia, you may need to be hospitalized so you and your baby can be monitored closely. You may also need to take medicine (magnesium sulfate) to prevent seizures and to lower blood pressure. This medicine may be given as an injection or through an IV. °· In some cases, if your condition gets worse, you may need to deliver your baby early. °Follow these instructions at home: °Eating and drinking ° °· Drink enough fluid to keep your urine pale yellow. °· Avoid caffeine. °Lifestyle °· Do not use any products that contain nicotine or tobacco, such as cigarettes and e-cigarettes. If you need help quitting, ask your health care provider. °· Do not use alcohol or drugs. °· Avoid stress as much as possible. Rest and get plenty of sleep. °General instructions °· Take over-the-counter and prescription medicines only as told by your health care provider. °· While lying down, lie on your left side. This keeps pressure off your major blood vessels. °· While sitting or lying down, raise (elevate) your feet. Try putting some pillows under your lower legs. °· Exercise regularly. Ask your health care provider what kinds of exercise are best for you. °· Keep all prenatal and follow-up visits as told by your health care provider. This is important. °Contact a health care provider if: °· You have symptoms that your health care provider told you may  require more treatment or monitoring, such as: °? Nausea or vomiting. °? Headache. °Get help right away if you have: °· Severe abdominal pain that does not get better with treatment. °· A severe headache that does not get better. °· Vomiting that does not get better. °· Sudden, rapid weight gain. °· Sudden swelling in your hands, ankles, or face. °· Vaginal bleeding. °· Blood in your urine. °· Fewer movements from your baby than usual. °· Blurred or double vision. °· Muscle twitching or sudden muscle tightening (spasms). °· Shortness of breath. °· Blue fingernails or lips. °Summary °· Hypertension, commonly called high blood pressure, is when the force of blood pumping through your arteries is too strong. °· Hypertension during pregnancy can cause problems for you and your baby. °· Treatment for hypertension during pregnancy varies depending on the type of hypertension you have and how serious it is. °· Get help right away if you have symptoms that your health care provider told you to watch for. °This information is not intended to replace advice given to you by your health care provider. Make sure you discuss any questions you have with your health care provider. °Document Released: 02/22/2011 Document Revised: 05/23/2017 Document Reviewed: 11/20/2015 °Elsevier Interactive Patient Education © 2019 Elsevier Inc. ° °

## 2018-10-26 NOTE — MAU Provider Note (Signed)
History     CSN: [id number]  Arrival date and time: 10/26/18 9562   First Provider Initiated Contact with Patient 10/26/18 1005      Chief Complaint  Patient presents with  . Hypertension   [name] is a 38 y.o. [id number] at [redacted]w[redacted]d who presents to MAU for HBP and "not feeling well." Pt was seen at the [facility] on Wednesday 10/24/2018 for a pregnancy test, which was positive. HD did not do an [place] or see her for a prenatal appt or give her anything for her BP. Pt reports she is not taking anything at this time for her BP. Pt reports she was taking lisinopril/HCTZ 20/25mg  and stopped taking them 3 months ago d/t the side effect of dizziness. Last pregnancy, pt was taking labetalol 200mg  BID that she reports worked for her.  Pt reports sure LMP of 09/14/2018 and reports regular, monthly periods.  Onset: last night Location: eyes Duration: 5hrs Character: blurry vision Aggravating/Associated: none/none Relieving: none Treatment: water - helped  Pt denies any issues at this time.  Pt denies VB, vaginal discharge/odor/itching. Pt denies N/V, abdominal pain, constipation, diarrhea, or urinary problems. Pt denies fever, chills, fatigue, sweating or changes in appetite. Pt denies SOB or chest pain. Pt denies dizziness, HA, light-headedness, weakness.  Problems this pregnancy include: cHTN. Allergies? NKDA Current medications/supplements? PNVs Prenatal care provider? [facility], has not yet made first prenatal appt.   OB History    Gravida  12   Para  3   Term      Preterm  3   AB  8   Living  3     SAB  8   TAB      Ectopic      Multiple      Live Births  3           Past Medical History:  Diagnosis Date  . Hypertension   . Miscarriage    x6    Past Surgical History:  Procedure Laterality Date  . CESAREAN SECTION     X 3    History reviewed. No pertinent family history.  Social History   Tobacco Use  . Smoking status: Never Smoker  .  Smokeless tobacco: Never Used  Substance Use Topics  . Alcohol use: No  . Drug use: No    Allergies: No Known Allergies  Medications Prior to Admission  Medication Sig Dispense Refill Last Dose  . amLODipine (NORVASC) 10 MG tablet Take 1 tablet (10 mg total) by mouth daily. 30 tablet 1 Unknown at Unknown time  . misoprostol (CYTOTEC) 200 MCG tablet Place 1 tablet (200 mcg total) vaginally once for 1 dose. If you want an IUD at your next appointment, place this tablet deep in the vagina the night before your visit. 1 tablet 0     Review of Systems  Constitutional: Negative for chills, diaphoresis, fatigue and fever.  Eyes: Positive for visual disturbance (not currently present).  Respiratory: Negative for shortness of breath.   Cardiovascular: Negative for chest pain.  Gastrointestinal: Negative for abdominal pain, constipation, diarrhea, nausea and vomiting.  Genitourinary: Negative for dysuria, flank pain, frequency, pelvic pain, urgency, vaginal bleeding and vaginal discharge.  Musculoskeletal: Negative for back pain.  Neurological: Negative for dizziness, weakness, light-headedness and headaches.   Physical Exam   Blood pressure (!) 142/78, pulse 89, temperature 98.9 F (37.2 C), temperature source Oral, resp. rate 17, weight 88.9 kg, last menstrual period 09/14/2018, SpO2 100 %.  Patient Vitals  for the past 24 hrs:  BP Temp Temp src Pulse Resp SpO2 Weight  10/26/18 1115 (!) 142/78 - - 89 - - -  10/26/18 1100 128/68 - - 90 - - -  10/26/18 1045 (!) 157/89 - - 92 - - -  10/26/18 1030 (!) 144/76 - - 93 - - -  10/26/18 1015 (!) 169/99 - - 95 - - -  10/26/18 1006 (!) 146/81 - - 96 - - -  10/26/18 1002 (!) 179/75 - - 100 - - -  10/26/18 0917 (!) 159/91 98.9 F (37.2 C) Oral 95 17 100 % 88.9 kg   Physical Exam  Constitutional: She is oriented to person, place, and time. She appears well-developed and well-nourished. No distress.  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  HENT:  Head: Normocephalic and atraumatic.  Respiratory: Effort normal.  GI: Soft. She exhibits no distension and no mass. There is no abdominal tenderness. There is no rebound and no guarding.  Neurological: She is alert and oriented to person, place, and time.  Skin: Skin is warm and dry. She is not diaphoretic.  Psychiatric: She has a normal mood and affect. Her behavior is normal. Judgment and thought content normal.   Results for orders placed or performed during the hospital encounter of 10/26/18 (from the past 24 hour(s))  Urinalysis, Routine w reflex microscopic     Status: Abnormal   Collection Time: 10/26/18  9:59 AM  Result Value Ref Range   Color, Urine YELLOW YELLOW   APPearance HAZY (A) CLEAR   Specific Gravity, Urine 1.020 1.005 - 1.030   pH 6.0 5.0 - 8.0   Glucose, UA NEGATIVE NEGATIVE mg/dL   Hgb urine dipstick SMALL (A) NEGATIVE   Bilirubin Urine NEGATIVE NEGATIVE   Ketones, ur NEGATIVE NEGATIVE mg/dL   Protein, ur NEGATIVE NEGATIVE mg/dL   Nitrite NEGATIVE NEGATIVE   Leukocytes,Ua NEGATIVE NEGATIVE   RBC / HPF 0-5 0 - 5 RBC/hpf   WBC, UA 0-5 0 - 5 WBC/hpf   Bacteria, UA MANY (A) NONE SEEN   Squamous Epithelial / LPF 0-5 0 - 5  Protein / creatinine ratio, urine     Status: None   Collection Time: 10/26/18  9:59 AM  Result Value Ref Range   Creatinine, Urine 150.43 mg/dL   Total Protein, Urine 11 mg/dL   Protein Creatinine Ratio 0.07 0.00 - 0.15 mg/mg[Cre]  CBC     Status: Abnormal   Collection Time: 10/26/18 10:08 AM  Result Value Ref Range   WBC 6.1 4.0 - 10.5 K/uL   RBC 4.91 3.87 - 5.11 MIL/uL   Hemoglobin 12.2 12.0 - 15.0 g/dL   HCT 39.1 36.0 - 46.0 %   MCV 79.6 (L) 80.0 - 100.0 fL   MCH 24.8 (L) 26.0 - 34.0 pg   MCHC 31.2 30.0 - 36.0 g/dL   RDW 14.2 11.5 - 15.5 %   Platelets 267 150 - 400 K/uL   nRBC 0.0 0.0 - 0.2 %  Comprehensive metabolic panel     Status: Abnormal   Collection Time: 10/26/18 10:08 AM  Result Value Ref Range    Sodium 138 135 - 145 mmol/L   Potassium 3.6 3.5 - 5.1 mmol/L   Chloride 100 98 - 111 mmol/L   CO2 26 22 - 32 mmol/L   Glucose, Bld 89 70 - 99 mg/dL   BUN 15 6 - 20 mg/dL   Creatinine, Ser 0.79 0.44 - 1.00 mg/dL  Calcium 9.8 8.9 - 10.3 mg/dL   Total Protein 8.1 6.5 - 8.1 g/dL   Albumin 4.1 3.5 - 5.0 g/dL   AST 27 15 - 41 U/L   ALT 67 (H) 0 - 44 U/L   Alkaline Phosphatase 91 38 - 126 U/L   Total Bilirubin 0.7 0.3 - 1.2 mg/dL   GFR calc non Af Amer >60 >60 mL/min   GFR calc Af Amer >60 >60 mL/min   Anion gap 12 5 - 15    No results found.  MAU Course  Procedures  MDM -HPB + blurry vision last night, possibly related to dehydration, not present at this time, pt reports she feels well today. -per Dr. Roselie Awkward, can give labetalol 200mg  BID as pt has been previously tested on this dose -first dose 200mg  labetalol given in MAU, BP after administration 128/68 and 142/78 -baseline labs drawn for preeclampsia -UA: hazy/sm hgb/many bacteria, sending urine for culture -PCr: 0.07 -CBC: platelets 267 -CMP: ALT 67, otherwise WNL -pt discharged to home in stable condition  Orders Placed This Encounter  Procedures  . Culture, OB Urine    Standing Status:   Standing    Number of Occurrences:   1  . CBC    Standing Status:   Standing    Number of Occurrences:   1  . Comprehensive metabolic panel    Standing Status:   Standing    Number of Occurrences:   1  . Urinalysis, Routine w reflex microscopic    Standing Status:   Standing    Number of Occurrences:   1  . Protein / creatinine ratio, urine    Standing Status:   Standing    Number of Occurrences:   1  . Discharge patient    Order Specific Question:   Discharge disposition    Answer:   01-Home or Self Care [1]    Order Specific Question:   Discharge patient date    Answer:   10/26/2018   Meds ordered this encounter  Medications  . labetalol (NORMODYNE) tablet 200 mg  . labetalol (NORMODYNE) 200 MG tablet    Sig: Take 1  tablet (200 mg total) by mouth 2 (two) times daily for 30 days.    Dispense:  60 tablet    Refill:  0    Order Specific Question:   Supervising Provider    Answer:   Woodroe Mode [[id number]]   Assessment and Plan   1. Chronic hypertension affecting pregnancy   2. [redacted] weeks gestation of pregnancy    Allergies as of 10/26/2018   No Known Allergies     Medication List    STOP taking these medications   amLODipine 10 MG tablet Commonly known as:  NORVASC   misoprostol 200 MCG tablet Commonly known as:  CYTOTEC     TAKE these medications   labetalol 200 MG tablet Commonly known as:  NORMODYNE Take 1 tablet (200 mg total) by mouth 2 (two) times daily for 30 days.       -discussed possible side effects of BP medications -discussed appropriate hydration and nutrition in pregnancy -discussed impt of taking BP medication as prescribed -pt to f/u at [facility] clinic in two days for BP check, message sent to clinic to call pt for appt. -pt to call [facility] to schedule initial prenatal appt -discussed that pt to discard old BP medications using bag provided by nurse -discussed return MAU precautions/s/sx of preeclampsia -pt discharged to home in stable condition  Gerrie Nordmann Britny Riel 10/26/2018, 11:31 AM

## 2018-10-26 NOTE — MAU Note (Signed)
Sent to HD, +preg test (10/24/18  Has paperwork with her).   BP was up.  They told her to  Come here. Chronic dx, is on medication.  Denies HA.

## 2018-10-27 LAB — CULTURE, OB URINE: Culture: <10000 — AB

## 2018-10-29 ENCOUNTER — Telehealth: Payer: Self-pay | Admitting: Obstetrics & Gynecology

## 2018-10-30 ENCOUNTER — Encounter: Payer: Self-pay | Admitting: Family Medicine

## 2018-10-30 ENCOUNTER — Ambulatory Visit (INDEPENDENT_AMBULATORY_CARE_PROVIDER_SITE_OTHER): Payer: Self-pay | Admitting: General Practice

## 2018-10-30 ENCOUNTER — Other Ambulatory Visit: Payer: Self-pay

## 2018-10-30 VITALS — BP 143/76 | HR 91 | Ht 61.0 in | Wt 195.0 lb

## 2018-10-30 DIAGNOSIS — O0991 Supervision of high risk pregnancy, unspecified, first trimester: Secondary | ICD-10-CM

## 2018-10-30 DIAGNOSIS — Z0130 Encounter for examination of blood pressure without abnormal findings: Secondary | ICD-10-CM

## 2018-10-30 MED ORDER — AMBULATORY NON FORMULARY MEDICATION: 1.0000 | Freq: Every day | 1 refills | Status: DC

## 2018-10-30 MED ORDER — LABETALOL HCL 200 MG PO TABS: 400.0000 mg | ORAL_TABLET | Freq: Two times a day (BID) | ORAL | 2 refills | Status: DC

## 2018-10-30 NOTE — Progress Notes (Signed)
Patient presents to office today for blood pressure check following recent MAU visit for elevated blood pressure in pregnancy- per review, patient was started on labetalol. Reports taking BP medication daily as prescribed. BP is elevated in office today- patient denies headaches or blurry vision.  Called Dr Rosana Hoes and discussed elevated BPs. Dr Rosana Hoes prescribes increase in labetalol to 400mg  BID with BP check in office in 1 week.   Reviewed increase in dose with patient and new Rx sent in. Also discussed follow up BP check in one week. Told patient we are also sending in a prescription for a BP cuff to be mailed to her house to monitor herself at home as well. Discussed the pharmacy may call her with questions and advised if she receives the cuff before her next appt to bring it with her for teaching. Patient verbalized understanding. 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 Explained to patient we cannot take her out of work at this time & that she is welcome to discuss her concerns at her new OB appt. Discussed the front office could give her a general pregnancy restrictions letter for now. Patient verbalized understanding & had no other questions.  Koren Bound RN BSN 10/30/18

## 2018-10-30 NOTE — Progress Notes (Signed)
I have reviewed this chart and agree with the RN/CMA assessment and management.    K. Meryl Leaira Fullam, M.D. Attending [facility] (Faculty Practice)   

## 2018-11-01 ENCOUNTER — Other Ambulatory Visit: Payer: Self-pay

## 2018-11-01 ENCOUNTER — Encounter (HOSPITAL_COMMUNITY): Payer: Self-pay | Admitting: Advanced Practice Midwife

## 2018-11-01 ENCOUNTER — Inpatient Hospital Stay (HOSPITAL_COMMUNITY)
Admission: AD | Admit: 2018-11-01 | Discharge: 2018-11-02 | Disposition: A | Discharge status: Home / Self Care | Payer: Medicaid Other | Attending: Obstetrics & Gynecology | Admitting: Obstetrics & Gynecology

## 2018-11-01 ENCOUNTER — Inpatient Hospital Stay (HOSPITAL_COMMUNITY)
Admission: AD | Admit: 2018-11-01 | Discharge: 2018-11-01 | Disposition: A | Discharge status: Home / Self Care | Payer: Medicaid Other | Attending: Obstetrics and Gynecology | Admitting: Obstetrics and Gynecology

## 2018-11-01 ENCOUNTER — Inpatient Hospital Stay (HOSPITAL_COMMUNITY): Payer: Medicaid Other

## 2018-11-01 DIAGNOSIS — O161 Unspecified maternal hypertension, first trimester: Secondary | ICD-10-CM | POA: Insufficient documentation

## 2018-11-01 DIAGNOSIS — Z3A01 Less than 8 weeks gestation of pregnancy: Secondary | ICD-10-CM | POA: Diagnosis not present

## 2018-11-01 DIAGNOSIS — O200 Threatened abortion: Secondary | ICD-10-CM

## 2018-11-01 DIAGNOSIS — Z6790 Unspecified blood type, Rh positive: Secondary | ICD-10-CM

## 2018-11-01 DIAGNOSIS — O4691 Antepartum hemorrhage, unspecified, first trimester: Secondary | ICD-10-CM

## 2018-11-01 DIAGNOSIS — Z79899 Other long term (current) drug therapy: Secondary | ICD-10-CM | POA: Insufficient documentation

## 2018-11-01 DIAGNOSIS — O3680X0 Pregnancy with inconclusive fetal viability, not applicable or unspecified: Secondary | ICD-10-CM

## 2018-11-01 DIAGNOSIS — O26891 Other specified pregnancy related conditions, first trimester: Secondary | ICD-10-CM | POA: Insufficient documentation

## 2018-11-01 DIAGNOSIS — O208 Other hemorrhage in early pregnancy: Secondary | ICD-10-CM | POA: Insufficient documentation

## 2018-11-01 DIAGNOSIS — D259 Leiomyoma of uterus, unspecified: Secondary | ICD-10-CM | POA: Insufficient documentation

## 2018-11-01 DIAGNOSIS — O3411 Maternal care for benign tumor of corpus uteri, first trimester: Secondary | ICD-10-CM | POA: Insufficient documentation

## 2018-11-01 DIAGNOSIS — R109 Unspecified abdominal pain: Secondary | ICD-10-CM | POA: Insufficient documentation

## 2018-11-01 DIAGNOSIS — O4690 Antepartum hemorrhage, unspecified, unspecified trimester: Secondary | ICD-10-CM

## 2018-11-01 DIAGNOSIS — O26899 Other specified pregnancy related conditions, unspecified trimester: Secondary | ICD-10-CM

## 2018-11-01 LAB — URINALYSIS, ROUTINE W REFLEX MICROSCOPIC
Bacteria, UA: NONE SEEN
Bilirubin Urine: NEGATIVE
Glucose, UA: NEGATIVE mg/dL
Ketones, ur: NEGATIVE mg/dL
Leukocytes,Ua: NEGATIVE
Nitrite: NEGATIVE
Protein, ur: NEGATIVE mg/dL
Specific Gravity, Urine: 1.005 (ref 1.005–1.030)
pH: 6 (ref 5.0–8.0)

## 2018-11-01 LAB — CBC
HCT: 36.5 % (ref 36.0–46.0)
Hemoglobin: 11.5 g/dL — ABNORMAL LOW (ref 12.0–15.0)
MCH: 24.9 pg — ABNORMAL LOW (ref 26.0–34.0)
MCHC: 31.5 g/dL (ref 30.0–36.0)
MCV: 79.2 fL — ABNORMAL LOW (ref 80.0–100.0)
Platelets: 253 10*3/uL (ref 150–400)
RBC: 4.61 MIL/uL (ref 3.87–5.11)
RDW: 14.4 % (ref 11.5–15.5)
WBC: 6.1 10*3/uL (ref 4.0–10.5)
nRBC: 0 % (ref 0.0–0.2)

## 2018-11-01 LAB — HCG, QUANTITATIVE, PREGNANCY: hCG, Beta Chain, Quant, S: 2696 m[IU]/mL — ABNORMAL HIGH (ref <5)

## 2018-11-01 LAB — WET PREP, GENITAL
Clue Cells Wet Prep HPF POC: NONE SEEN
Sperm: NONE SEEN
Trich, Wet Prep: NONE SEEN
Yeast Wet Prep HPF POC: NONE SEEN

## 2018-11-01 MED ORDER — LABETALOL HCL 100 MG PO TABS
400.0000 mg | ORAL_TABLET | Freq: Once | ORAL | Status: AC
  Administered 2018-11-01: 400 mg via ORAL
  Filled 2018-11-01: qty 4

## 2018-11-01 NOTE — MAU Provider Note (Signed)
History     CSN: [id number]  Arrival date and time: 11/01/18 4010   First Provider Initiated Contact with Patient 11/01/18 0805      Chief Complaint  Patient presents with  . Vaginal Bleeding   38 y.o. female [id number] @[redacted]w[redacted]d  by sure LMP presenting with VB. Reports seeing dark colored discharge when she wiped this am. No recent IC. No abd or pelvic pain. No other sx. Hx of CHTN, did not take her BP med this am.   OB History    Gravida  12   Para  3   Term  1   Preterm  2   AB  8   Living  3     SAB  8   TAB      Ectopic      Multiple      Live Births  3           Past Medical History:  Diagnosis Date  . Hypertension   . Miscarriage    x6    Past Surgical History:  Procedure Laterality Date  . CESAREAN SECTION     X 3    Family History  Problem Relation Age of Onset  . Hypertension Mother     Social History   Tobacco Use  . Smoking status: Never Smoker  . Smokeless tobacco: Never Used  Substance Use Topics  . Alcohol use: No  . Drug use: No    Allergies: No Known Allergies  No medications prior to admission.    Review of Systems  Constitutional: Negative for fever.  Gastrointestinal: Negative for abdominal pain.  Genitourinary: Positive for vaginal bleeding.   Physical Exam   Blood pressure (!) 149/79, pulse 80, temperature 98.3 F (36.8 C), temperature source Oral, resp. rate 20, weight 88 kg, last menstrual period 09/14/2018, SpO2 100 %.  Physical Exam  Nursing note and vitals reviewed. Constitutional: She is oriented to person, place, and time. She appears well-developed and well-nourished. No distress.  HENT:  Head: Normocephalic and atraumatic.  Neck: Normal range of motion.  Cardiovascular: Normal rate.  Respiratory: Effort normal. No respiratory distress.  GI: Soft. She exhibits no distension and no mass. There is no abdominal tenderness. There is no rebound and no guarding.  Genitourinary:    Genitourinary Comments:  External: no lesions or erythema Vagina: rugated, pink, moist, mod dark thick bloody discharge Uterus: + enlarged, anteverted, non tender, no CMT Adnexae: no masses, no tenderness left, no tenderness right Cervix closed    Musculoskeletal: Normal range of motion.  Neurological: She is alert and oriented to person, place, and time.  Skin: Skin is warm and dry.  Psychiatric: She has a normal mood and affect.   Results for orders placed or performed during the hospital encounter of 11/01/18 (from the past 24 hour(s))  Urinalysis, Routine w reflex microscopic     Status: Abnormal   Collection Time: 11/01/18  8:19 AM  Result Value Ref Range   Color, Urine STRAW (A) YELLOW   APPearance CLEAR CLEAR   Specific Gravity, Urine 1.005 1.005 - 1.030   pH 6.0 5.0 - 8.0   Glucose, UA NEGATIVE NEGATIVE mg/dL   Hgb urine dipstick SMALL (A) NEGATIVE   Bilirubin Urine NEGATIVE NEGATIVE   Ketones, ur NEGATIVE NEGATIVE mg/dL   Protein, ur NEGATIVE NEGATIVE mg/dL   Nitrite NEGATIVE NEGATIVE   Leukocytes,Ua NEGATIVE NEGATIVE   WBC, UA 0-5 0 - 5 WBC/hpf   Bacteria, UA NONE SEEN NONE SEEN  Squamous Epithelial / LPF 0-5 0 - 5  Wet prep, genital     Status: Abnormal   Collection Time: 11/01/18  8:19 AM  Result Value Ref Range   Yeast Wet Prep HPF POC NONE SEEN NONE SEEN   Trich, Wet Prep NONE SEEN NONE SEEN   Clue Cells Wet Prep HPF POC NONE SEEN NONE SEEN   WBC, Wet Prep HPF POC MANY (A) NONE SEEN   Sperm NONE SEEN   CBC     Status: Abnormal   Collection Time: 11/01/18  8:31 AM  Result Value Ref Range   WBC 6.1 4.0 - 10.5 K/uL   RBC 4.61 3.87 - 5.11 MIL/uL   Hemoglobin 11.5 (L) 12.0 - 15.0 g/dL   HCT 36.5 36.0 - 46.0 %   MCV 79.2 (L) 80.0 - 100.0 fL   MCH 24.9 (L) 26.0 - 34.0 pg   MCHC 31.5 30.0 - 36.0 g/dL   RDW 14.4 11.5 - 15.5 %   Platelets 253 150 - 400 K/uL   nRBC 0.0 0.0 - 0.2 %   US Ob Less Than 14 Weeks With Ob Transvaginal  Result Date: 11/01/2018 CLINICAL DATA:  Vaginal  bleeding EXAM: OBSTETRIC <14 WK [place] AND TRANSVAGINAL OB US TECHNIQUE: Both transabdominal and transvaginal ultrasound examinations were performed for complete evaluation of the gestation as well as the maternal uterus, adnexal regions, and pelvic cul-de-sac. Transvaginal technique was performed to assess early pregnancy. COMPARISON:  None. FINDINGS: Intrauterine gestational sac: Visualized Yolk sac:  Questionable Embryo:  Not visualized Cardiac Activity: Not visualized MSD: 7 mm   5 w   3 d Subchorionic hemorrhage: There is a 6 x 5 mm focus of subchorionic hemorrhage. Maternal uterus/adnexae: There is a calcified leiomyoma measuring 1.5 x 1.3 x 1.4 cm in the anterior leftward aspect of the uterus. Cervical os is closed. Right ovary measures 3.2 x 1.9 x 2.1 cm. Left ovary measures 3.6 x 2.6 x 1.8 cm. No extrauterine pelvic mass. No free pelvic fluid. IMPRESSION: Probable early intrauterine gestational sac, but no fetal pole or cardiac activity yet visualized. Questionable yolk sac appreciable, less than optimally visualized. Recommend follow-up quantitative B-HCG levels and follow-up US in 14 days to assess viability. This recommendation follows SRU consensus guidelines: Diagnostic Criteria for Nonviable Pregnancy Early in the First Trimester. [place] Med 2013; 235:3614-43. based on gestational sac size, estimated gestational age is 5+ weeks. There is a 6 x 5 mm subchorionic hemorrhage. There is a 1.5 x 1.3 x 1.4 cm partially calcified leiomyoma within the uterus anteriorly. No extrauterine pelvic or adnexal mass evident. Electronically Signed   By: Lowella Grip III M.D.   On: 11/01/2018 09:05   MAU Course  Procedures Orders Placed This Encounter  Procedures  . Wet prep, genital    Standing Status:   Standing    Number of Occurrences:   1    Order Specific Question:   Patient immune status    Answer:   Normal  . US OB LESS THAN 14 WEEKS WITH OB TRANSVAGINAL    Standing Status:   Standing    Number  of Occurrences:   1    Order Specific Question:   Symptom/Reason for Exam    Answer:   Vaginal bleeding in pregnancy [154008]  . Urinalysis, Routine w reflex microscopic    Standing Status:   Standing    Number of Occurrences:   1  . CBC    Standing Status:   Standing  Number of Occurrences:   1  . hCG, quantitative, pregnancy    Standing Status:   Standing    Number of Occurrences:   1  . Discharge patient    Order Specific Question:   Discharge disposition    Answer:   01-Home or Self Care [1]    Order Specific Question:   Discharge patient date    Answer:   11/01/2018   MDM Labs and [place] ordered and reviewed. US shows IUGS (smaller than expected) and possible yolk sac, this is probably an early pregnancy but cannot r/o failed pregnancy or ectopic. Recommend serial quant and to return to MAU in 2 days (Saturday). Return precautions discussed. Stable for discharge home.   Assessment and Plan   1. Vaginal bleeding in pregnancy   2. Blood type, Rh positive   3. Pregnancy of unknown anatomic location    Discharge home Follow up at MAU on 11/03/18 in am for labs SAB/return precautions  Allergies as of 11/01/2018   No Known Allergies     Medication List    TAKE these medications   AMBULATORY NON FORMULARY MEDICATION 1 Device by Other route daily. Blood pressure cuff size large to monitor blood pressure at home weekly. ICD 10 dx: O09.90 high risk pregnancy   labetalol 200 MG tablet Commonly known as:  NORMODYNE Take 2 tablets (400 mg total) by mouth 2 (two) times daily for 30 days.   multivitamin-prenatal 27-0.8 MG Tabs tablet Take 1 tablet by mouth daily at 12 noon.       Julianne Handler, CNM 11/01/2018, 9:57 AM

## 2018-11-01 NOTE — MAU Note (Signed)
Was seen MAU this am for vag bleeding. Told to return if started having pain. Tonight started having some lower abd cramping and more vag. Bleeding. Has used 3 pads today. Few stringy clots

## 2018-11-01 NOTE — Discharge Instructions (Signed)
Vaginal Bleeding During Pregnancy, First Trimester    A small amount of bleeding (spotting) from the vagina is common during early pregnancy. Sometimes the bleeding is normal and does not cause problems. At other times, though, bleeding may be a sign of something serious. Tell your doctor about any bleeding from your vagina right away.  Follow these instructions at home:  Activity  · Follow your doctor's instructions about how active you can be.  · If needed, make plans for someone to help with your normal activities.  · Do not have sex or orgasms until your doctor says that this is safe.  General instructions  · Take over-the-counter and prescription medicines only as told by your doctor.  · Watch your condition for any changes.  · Write down:  ? The number of pads you use each day.  ? How often you change pads.  ? How soaked (saturated) your pads are.  · Do not use tampons.  · Do not douche.  · If you pass any tissue from your vagina, save it to show to your doctor.  · Keep all follow-up visits as told by your doctor. This is important.  Contact a doctor if:  · You have vaginal bleeding at any time while you are pregnant.  · You have cramps.  · You have a fever.  Get help right away if:  · You have very bad cramps in your back or belly (abdomen).  · You pass large clots or a lot of tissue from your vagina.  · Your bleeding gets worse.  · You feel light-headed.  · You feel weak.  · You pass out (faint).  · You have chills.  · You are leaking fluid from your vagina.  · You have a gush of fluid from your vagina.  Summary  · Sometimes vaginal bleeding during pregnancy is normal and does not cause problems. At other times, bleeding may be a sign of something serious.  · Tell your doctor about any bleeding from your vagina right away.  · Follow your doctor's instructions about how active you can be. You may need someone to help you with your normal activities.  This information is not intended to replace advice given to  you by your health care provider. Make sure you discuss any questions you have with your health care provider.  Document Released: 10/21/2013 Document Revised: 09/07/2016 Document Reviewed: 09/07/2016  Elsevier Interactive Patient Education © 2019 [employer]

## 2018-11-01 NOTE — MAU Note (Signed)
Pt presents to MAU with complaints of small amount of vaginal bleeding when she wiped this morning. Denies any pain

## 2018-11-02 DIAGNOSIS — O26891 Other specified pregnancy related conditions, first trimester: Secondary | ICD-10-CM | POA: Diagnosis not present

## 2018-11-02 DIAGNOSIS — O039 Complete or unspecified spontaneous abortion without complication: Secondary | ICD-10-CM | POA: Diagnosis not present

## 2018-11-02 DIAGNOSIS — O208 Other hemorrhage in early pregnancy: Secondary | ICD-10-CM | POA: Diagnosis not present

## 2018-11-02 DIAGNOSIS — R109 Unspecified abdominal pain: Secondary | ICD-10-CM | POA: Diagnosis not present

## 2018-11-02 DIAGNOSIS — O200 Threatened abortion: Secondary | ICD-10-CM

## 2018-11-02 DIAGNOSIS — Z79899 Other long term (current) drug therapy: Secondary | ICD-10-CM | POA: Diagnosis not present

## 2018-11-02 DIAGNOSIS — Z3A01 Less than 8 weeks gestation of pregnancy: Secondary | ICD-10-CM | POA: Diagnosis not present

## 2018-11-02 DIAGNOSIS — O161 Unspecified maternal hypertension, first trimester: Secondary | ICD-10-CM | POA: Diagnosis not present

## 2018-11-02 LAB — GC/CHLAMYDIA PROBE AMP (~~LOC~~) NOT AT ARMC
Chlamydia: NEGATIVE
Neisseria Gonorrhea: NEGATIVE

## 2018-11-02 NOTE — MAU Provider Note (Signed)
Chief Complaint: Abdominal Pain   First Provider Initiated Contact with Patient 11/02/18 0007        SUBJECTIVE HPI: Shelley Young is a 38 y.o. [id number] at [redacted]w[redacted]d by LMP who presents to maternity admissions reporting cramping which has gotten somewhat worse this evening.  Was seen here this morning for small vaginal bleeding.  US findings were inconclusive, only seeing a Gestational Sac.  States has only used 3 pads all day.  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  . She denies vaginal itching/burning, urinary symptoms, h/a, dizziness, n/v, or fever/chills.   RN Note: Was seen MAU this am for vag bleeding. Told to return if started having pain. Tonight started having some lower abd cramping and more vag. Bleeding. Has used 3 pads today. Few stringy clots  Past Medical History:  Diagnosis Date  . Hypertension   . Miscarriage    x6   Past Surgical History:  Procedure Laterality Date  . CESAREAN SECTION     X 3   Social History   Socioeconomic History  . Marital status: Divorced    Spouse name: Not on file  . Number of children: Not on file  . Years of education: Not on file  . Highest education level: Not on file  Occupational History  . Not on file  Social Needs  . Financial resource strain: Not on file  . Food insecurity:    Worry: Not on file    Inability: Not on file  . Transportation needs:    Medical: Not on file    Non-medical: Not on file  Tobacco Use  . Smoking status: Never Smoker  . Smokeless tobacco: Never Used  Substance and Sexual Activity  . Alcohol use: No  . Drug use: No  . Sexual activity: Yes    Birth control/protection: None    Comment: last sex one month ago  Lifestyle  . Physical activity:    Days per week: Not on file    Minutes per session: Not on file  . Stress: Not on file  Relationships  . Social connections:    Talks on phone: Not on file    Gets together: Not on file    Attends religious service: Not on file    Active member of club or organization: Not on file    Attends meetings of clubs or organizations: Not on file    Relationship status: Not on file  . Intimate partner violence:    Fear of current or ex partner: Not on file    Emotionally abused: Not on file    Physically abused: Not on file    Forced sexual activity: Not on file  Other Topics Concern  . Not on file  Social History Narrative  . Not on file   No current facility-administered medications on file prior to encounter.    Current Outpatient Medications on File Prior to Encounter  Medication Sig Dispense Refill  . AMBULATORY NON FORMULARY MEDICATION 1 Device by Other route daily. Blood pressure cuff size large to monitor blood pressure at home weekly. ICD 10 dx: O09.90 high risk pregnancy 1 Device 1  . labetalol (NORMODYNE) 200 MG tablet Take 2 tablets (400 mg total) by mouth 2 (two) times daily for 30 days. 120 tablet 2  . Prenatal Vit-Fe Fumarate-FA (MULTIVITAMIN-PRENATAL) 27-0.8 MG TABS tablet Take 1 tablet by mouth daily at 12 noon.     No Known Allergies  I have reviewed patient's Past Medical Hx, Surgical  Hx, Family Hx, Social Hx, medications and allergies.   ROS:  Review of Systems  Constitutional: Negative for chills and fever.  Respiratory: Negative for shortness of breath.   Gastrointestinal: Positive for abdominal pain. Negative for constipation, diarrhea and nausea.  Genitourinary: Positive for pelvic pain and vaginal bleeding.   Review of Systems  Other systems negative   Physical Exam  Patient Vitals for the past 24 hrs:  BP Temp Pulse Resp Height Weight  11/01/18 2310 129/77 98.7 F (37.1 C) 84 18 5\' 1"  (1.549 m) 88.9 kg   Constitutional: Well-developed, well-nourished female in no acute distress.  Cardiovascular: normal rate Respiratory: normal effort MS: Extremities nontender, no edema, normal ROM Neurologic: Alert and oriented x 4.   PELVIC EXAM: deferred  LAB RESULTS Results for orders  placed or performed during the hospital encounter of 11/01/18 (from the past 24 hour(s))  Urinalysis, Routine w reflex microscopic     Status: Abnormal   Collection Time: 11/01/18  8:19 AM  Result Value Ref Range   Color, Urine STRAW (A) YELLOW   APPearance CLEAR CLEAR   Specific Gravity, Urine 1.005 1.005 - 1.030   pH 6.0 5.0 - 8.0   Glucose, UA NEGATIVE NEGATIVE mg/dL   Hgb urine dipstick SMALL (A) NEGATIVE   Bilirubin Urine NEGATIVE NEGATIVE   Ketones, ur NEGATIVE NEGATIVE mg/dL   Protein, ur NEGATIVE NEGATIVE mg/dL   Nitrite NEGATIVE NEGATIVE   Leukocytes,Ua NEGATIVE NEGATIVE   WBC, UA 0-5 0 - 5 WBC/hpf   Bacteria, UA NONE SEEN NONE SEEN   Squamous Epithelial / LPF 0-5 0 - 5  Wet prep, genital     Status: Abnormal   Collection Time: 11/01/18  8:19 AM  Result Value Ref Range   Yeast Wet Prep HPF POC NONE SEEN NONE SEEN   Trich, Wet Prep NONE SEEN NONE SEEN   Clue Cells Wet Prep HPF POC NONE SEEN NONE SEEN   WBC, Wet Prep HPF POC MANY (A) NONE SEEN   Sperm NONE SEEN   CBC     Status: Abnormal   Collection Time: 11/01/18  8:31 AM  Result Value Ref Range   WBC 6.1 4.0 - 10.5 K/uL   RBC 4.61 3.87 - 5.11 MIL/uL   Hemoglobin 11.5 (L) 12.0 - 15.0 g/dL   HCT 36.5 36.0 - 46.0 %   MCV 79.2 (L) 80.0 - 100.0 fL   MCH 24.9 (L) 26.0 - 34.0 pg   MCHC 31.5 30.0 - 36.0 g/dL   RDW 14.4 11.5 - 15.5 %   Platelets 253 150 - 400 K/uL   nRBC 0.0 0.0 - 0.2 %  hCG, quantitative, pregnancy     Status: Abnormal   Collection Time: 11/01/18  8:31 AM  Result Value Ref Range   hCG, Beta Chain, Quant, S 2,696 (H) <5 mIU/mL       IMAGING US Ob Less Than 14 Weeks With Ob Transvaginal  Result Date: 11/01/2018 CLINICAL DATA:  Vaginal bleeding EXAM: OBSTETRIC <14 WK [place] AND TRANSVAGINAL OB US TECHNIQUE: Both transabdominal and transvaginal ultrasound examinations were performed for complete evaluation of the gestation as well as the maternal uterus, adnexal regions, and pelvic cul-de-sac. Transvaginal  technique was performed to assess early pregnancy. COMPARISON:  None. FINDINGS: Intrauterine gestational sac: Visualized Yolk sac:  Questionable Embryo:  Not visualized Cardiac Activity: Not visualized MSD: 7 mm   5 w   3 d Subchorionic hemorrhage: There is a 6 x 5 mm focus of subchorionic hemorrhage. Maternal  uterus/adnexae: There is a calcified leiomyoma measuring 1.5 x 1.3 x 1.4 cm in the anterior leftward aspect of the uterus. Cervical os is closed. Right ovary measures 3.2 x 1.9 x 2.1 cm. Left ovary measures 3.6 x 2.6 x 1.8 cm. No extrauterine pelvic mass. No free pelvic fluid. IMPRESSION: Probable early intrauterine gestational sac, but no fetal pole or cardiac activity yet visualized. Questionable yolk sac appreciable, less than optimally visualized. Recommend follow-up quantitative B-HCG levels and follow-up US in 14 days to assess viability. This recommendation follows SRU consensus guidelines: Diagnostic Criteria for Nonviable Pregnancy Early in the First Trimester. [place] Med 2013; 191:4782-95. based on gestational sac size, estimated gestational age is 5+ weeks. There is a 6 x 5 mm subchorionic hemorrhage. There is a 1.5 x 1.3 x 1.4 cm partially calcified leiomyoma within the uterus anteriorly. No extrauterine pelvic or adnexal mass evident. Electronically Signed   By: Lowella Grip III M.D.   On: 11/01/2018 09:05    MAU Management/MDM: Discussed situation involving threatened abortion Discussed that I spoke with Dr Roselie Awkward who agreed that it is too soon to redraw an HCG level at this time (only 12 hrs). Discussed inability to do anything to prevent bleeding or cramping.  Reviewed warning signs of heavy bleeding to return for.     ASSESSMENT 1. Abdominal cramping affecting pregnancy, antepartum   2. Threatened abortion     PLAN Discharge home Plan to repeat HCG level on Saturday as scheduled  If she comes back in with more bleeding or pain, we can draw an HCG but it wont change  outcome, it would only help [place] tell her what is going on with pregnancy.   Encouraged to return here or to other Urgent Care/ED if she develops worsening of symptoms, increase in pain, fever, or other concerning symptoms.   Follow-up Information    [facility]. Go in 2 day(s).   Specialty:  Obstetrics and Gynecology Contact information: [address] [id number] [place] [phone]         Pt stable at time of discharge. Encouraged to return here or to other Urgent Care/ED if she develops worsening of symptoms, increase in pain, fever, or other concerning symptoms.    Hansel Feinstein CNM, MSN Certified Nurse-Midwife 11/02/2018  12:19 AM

## 2018-11-02 NOTE — Discharge Instructions (Signed)
Threatened Miscarriage    A threatened miscarriage is when you have bleeding from your vagina during the first 20 weeks of pregnancy but the pregnancy does not end. Your doctor will do tests to make sure you are still pregnant. The cause of the bleeding may not be known. This condition does not mean your pregnancy will end, but it does increase the risk that it will end (complete miscarriage).  Follow these instructions at home:  · Get plenty of rest.  · If you have bleeding in your vagina, do not have sex or use tampons.  · Do not douche.  · Do not smoke or use drugs.  · Do not drink alcohol.  · Avoid caffeine.  · Keep all follow-up prenatal visits as told by your doctor. This is important.  Contact a doctor if:  · You have light bleeding from your vagina.  · You have belly pain or cramping.  · You have a fever.  Get help right away if:  · You have heavy bleeding from your vagina.  · You have clots of blood coming from your vagina.  · You pass tissue from your vagina.  · You have a gush of fluid from your vagina.  · You are leaking fluid from your vagina.  · You have very bad pain or cramps in your low back or belly.  · You have fever, chills, and very bad belly pain.  Summary  · A threatened miscarriage is when you have bleeding from your vagina during the first 20 weeks of pregnancy but the pregnancy does not end.  · This condition does not mean your pregnancy will end, but it does increase the risk that it will end (complete miscarriage).  · Get plenty of rest. If you have bleeding in your vagina, do not have sex or use tampons.  · Keep all follow-up prenatal visits as told by your doctor. This is important.  This information is not intended to replace advice given to you by your health care provider. Make sure you discuss any questions you have with your health care provider.  Document Released: 05/19/2008 Document Revised: 09/02/2016 Document Reviewed: 09/02/2016  Elsevier Interactive Patient Education © 2019  Elsevier Inc.

## 2018-11-02 NOTE — MAU Note (Signed)
Hansel Feinstein CNM in [place] to discuss plan of care with pt.

## 2018-11-02 NOTE — Progress Notes (Signed)
Written and verbal d/c instructions given and understanding voiced. 

## 2018-11-03 ENCOUNTER — Inpatient Hospital Stay (HOSPITAL_COMMUNITY)
Admission: AD | Admit: 2018-11-03 | Discharge: 2018-11-03 | Disposition: A | Discharge status: Home / Self Care | Payer: Medicaid Other | Attending: Obstetrics and Gynecology | Admitting: Obstetrics and Gynecology

## 2018-11-03 ENCOUNTER — Other Ambulatory Visit: Payer: Self-pay

## 2018-11-03 DIAGNOSIS — O039 Complete or unspecified spontaneous abortion without complication: Secondary | ICD-10-CM | POA: Diagnosis not present

## 2018-11-03 DIAGNOSIS — Z3A01 Less than 8 weeks gestation of pregnancy: Secondary | ICD-10-CM

## 2018-11-03 LAB — HCG, QUANTITATIVE, PREGNANCY: hCG, Beta Chain, Quant, S: 373 m[IU]/mL — ABNORMAL HIGH (ref <5)

## 2018-11-03 NOTE — MAU Note (Signed)
Shelley Young is a 38 y.o. at [redacted]w[redacted]d here in MAU reporting: here for follow up hcg. States she is still bleeding, states she is changing a pad every 2 hour because it is dirty enough to change. States the bleeding is the same as It has been. No pain.  Pain score: 0/10  Vitals:   11/03/18 0729  BP: 126/81  Pulse: 84  Resp: 18  Temp: 98.5 F (36.9 C)  SpO2: 100%      Lab orders placed from triage: hcg order entered by provider

## 2018-11-03 NOTE — MAU Provider Note (Addendum)
Subjective:  Shelley Young is a 38 y.o. [id number] at [redacted]w[redacted]d who presents today for FU BHCG. She was seen on 11/01/2018 and diagnosed with pregnancy of unknown location. Results from that day show no IUP on [place], and HCG 2,696. She reports vaginal bleeding, needing to use 2 pads in the last 24hrs. She denies abdominal or pelvic pain.   Objective:  Physical Exam  Nursing note and vitals reviewed.  Patient Vitals for the past 24 hrs:  BP Temp Temp src Pulse Resp SpO2 Height Weight  11/03/18 0729 126/81 98.5 F (36.9 C) Oral 84 18 100 % - -  11/03/18 0726 - - - - - - 5\' 1"  (1.549 m) 88.8 kg    Constitutional: She is oriented to person, place, and time. She appears well-developed and well-nourished. No distress.  HENT:  Head: Normocephalic.  Cardiovascular: Normal rate.  Respiratory: Effort normal.  GI: Soft. There is no tenderness.  Neurological: She is alert and oriented to person, place, and time. Skin: Skin is warm and dry.  Psychiatric: She has a normal mood and affect.   Results for orders placed or performed during the hospital encounter of 11/03/18 (from the past 24 hour(s))  hCG, quantitative, pregnancy     Status: Abnormal   Collection Time: 11/03/18  7:58 AM  Result Value Ref Range   hCG, Beta Chain, Quant, S 373 (H) <5 mIU/mL    Assessment/Plan: 1. Miscarriage   -significant drop in HCG -RH positive -Msg sent to [place] for SAB f/u appts  Jorje Guild, NP

## 2018-11-03 NOTE — MAU Note (Signed)
Per Robyne Askew NP, pt can leave after blood has been drawn. Will call pt with results. Phone number confirmed with patient and patient is agreeable with the plan and leaves.

## 2018-11-06 ENCOUNTER — Ambulatory Visit: Payer: Medicaid Other

## 2018-11-13 ENCOUNTER — Other Ambulatory Visit: Payer: Medicaid Other

## 2018-11-13 ENCOUNTER — Other Ambulatory Visit: Payer: Self-pay

## 2018-11-13 DIAGNOSIS — O039 Complete or unspecified spontaneous abortion without complication: Secondary | ICD-10-CM

## 2018-11-14 LAB — BETA HCG QUANT (REF LAB): hCG Quant: 8 m[IU]/mL

## 2018-11-20 ENCOUNTER — Encounter: Payer: Self-pay | Admitting: Obstetrics and Gynecology

## 2018-11-20 ENCOUNTER — Other Ambulatory Visit: Payer: Medicaid Other

## 2018-11-20 ENCOUNTER — Telehealth (INDEPENDENT_AMBULATORY_CARE_PROVIDER_SITE_OTHER): Payer: Medicaid Other | Admitting: Obstetrics and Gynecology

## 2018-11-20 ENCOUNTER — Other Ambulatory Visit: Payer: Self-pay

## 2018-11-20 DIAGNOSIS — O039 Complete or unspecified spontaneous abortion without complication: Secondary | ICD-10-CM

## 2018-11-20 DIAGNOSIS — Z975 Presence of (intrauterine) contraceptive device: Secondary | ICD-10-CM

## 2018-11-20 NOTE — Progress Notes (Signed)
TELEHEALTH GYNECOLOGY VIRTUAL VIDEO VISIT ENCOUNTER NOTE  Provider location: [facility] at [facility]   I connected with Shelley Young on 11/20/18 at 11:15 AM EDT by MyChart Video Encounter at home and verified that I am speaking with the correct person using two identifiers.   I discussed the limitations, risks, security and privacy concerns of performing an evaluation and management service by telephone and the availability of in person appointments. I also discussed with the patient that there may be a patient responsible charge related to this service. The patient expressed understanding and agreed to proceed.   History:  Shelley Young is a 38 y.o. [phone] female being evaluated today for likely SAB, seen in MAU. [place] with probably gestational sac but no definitive IUP. Had significantly decreased HCG 1 week later. 2696 -> 373 -> 8  She is doing well, denies pain or vaginal bleeding. Bleeding stopped a week ago. Denies issues with eating, voiding, using the restroom. Denies light-headedness or dizziness. Denies headache.     Past Medical History:  Diagnosis Date   Hypertension    Miscarriage    x6   Past Surgical History:  Procedure Laterality Date   CESAREAN SECTION     X 3   The following portions of the patient's history were reviewed and updated as appropriate: allergies, current medications, past family history, past medical history, past social history, past surgical history and problem list.   Health Maintenance:  ASCUS pap and negative HRHPV on 10/30/17.  Normal mammogram n/a.   Review of Systems:  Pertinent items noted in HPI and remainder of comprehensive ROS otherwise negative.  Physical Exam:   General:  Alert, oriented and cooperative. Patient appears to be in no acute distress.  Mental Status: Normal mood and affect. Normal behavior. Normal judgment and thought content.   Respiratory: Normal respiratory effort, no problems  with respiration noted  Rest of physical exam deferred due to type of encounter  Labs and Imaging Results for orders placed or performed in visit on 11/13/18 (from the past 336 hour(s))  Beta hCG quant (ref lab)   Collection Time: 11/13/18 12:23 PM  Result Value Ref Range   hCG Quant 8 mIU/mL   US Ob Less Than 14 Weeks With Ob Transvaginal  Result Date: 11/01/2018 CLINICAL DATA:  Vaginal bleeding EXAM: OBSTETRIC <14 WK [place] AND TRANSVAGINAL OB US TECHNIQUE: Both transabdominal and transvaginal ultrasound examinations were performed for complete evaluation of the gestation as well as the maternal uterus, adnexal regions, and pelvic cul-de-sac. Transvaginal technique was performed to assess early pregnancy. COMPARISON:  None. FINDINGS: Intrauterine gestational sac: Visualized Yolk sac:  Questionable Embryo:  Not visualized Cardiac Activity: Not visualized MSD: 7 mm   5 w   3 d Subchorionic hemorrhage: There is a 6 x 5 mm focus of subchorionic hemorrhage. Maternal uterus/adnexae: There is a calcified leiomyoma measuring 1.5 x 1.3 x 1.4 cm in the anterior leftward aspect of the uterus. Cervical os is closed. Right ovary measures 3.2 x 1.9 x 2.1 cm. Left ovary measures 3.6 x 2.6 x 1.8 cm. No extrauterine pelvic mass. No free pelvic fluid. IMPRESSION: Probable early intrauterine gestational sac, but no fetal pole or cardiac activity yet visualized. Questionable yolk sac appreciable, less than optimally visualized. Recommend follow-up quantitative B-HCG levels and follow-up US in 14 days to assess viability. This recommendation follows SRU consensus guidelines: Diagnostic Criteria for Nonviable Pregnancy Early in the First Trimester. [place] Med 2013; 696:7893-81. based  on gestational sac size, estimated gestational age is 5+ weeks. There is a 6 x 5 mm subchorionic hemorrhage. There is a 1.5 x 1.3 x 1.4 cm partially calcified leiomyoma within the uterus anteriorly. No extrauterine pelvic or adnexal mass evident.  Electronically Signed   By: Lowella Grip III M.D.   On: 11/01/2018 09:05       Assessment and Plan:     1. Miscarriage Likely SAB, still weakly positive - will repeat HCG this week, patient can come today for lab visit - Beta hCG quant (ref lab)  2. Contraception, device intrauterine - Wants paraguard IUD placed - make appt for placement    I discussed the assessment and treatment plan with the patient. The patient was provided an opportunity to ask questions and all were answered. The patient agreed with the plan and demonstrated an understanding of the instructions.   The patient was advised to call back or seek an in-person evaluation/go to the ED if the symptoms worsen or if the condition fails to improve as anticipated.  I provided 12 minutes of face-to-face time during this encounter.   Sloan Leiter, MD [facility] [place], [place]

## 2018-11-20 NOTE — Progress Notes (Signed)
Pt advised, per Dr. Jordan Hawks message, pt advised that she would need to come in for repeat bhcg. Pt verbalized understanding and will come today at 1410.  I connected with  Bo Merino on 11/20/18 at 11:15 AM EDT by telephone and verified that I am speaking with the correct person using two identifiers.   I discussed the limitations, risks, security and privacy concerns of performing an evaluation and management service by telephone and the availability of in person appointments. I also discussed with the patient that there may be a patient responsible charge related to this service. The patient expressed understanding and agreed to proceed.  Dolores Hoose, RN 11/20/2018  11:08 AM

## 2018-11-21 LAB — BETA HCG QUANT (REF LAB): hCG Quant: 3 m[IU]/mL

## 2018-11-26 ENCOUNTER — Encounter: Payer: Medicaid Other | Admitting: Obstetrics and Gynecology

## 2018-12-05 ENCOUNTER — Other Ambulatory Visit: Payer: Self-pay

## 2018-12-05 ENCOUNTER — Other Ambulatory Visit (HOSPITAL_COMMUNITY)
Admission: RE | Admit: 2018-12-05 | Discharge: 2018-12-05 | Disposition: A | Discharge status: Home / Self Care | Payer: Medicaid Other | Source: Ambulatory Visit | Attending: Family Medicine | Admitting: Family Medicine

## 2018-12-05 ENCOUNTER — Ambulatory Visit (INDEPENDENT_AMBULATORY_CARE_PROVIDER_SITE_OTHER): Payer: Medicaid Other | Admitting: Obstetrics and Gynecology

## 2018-12-05 ENCOUNTER — Encounter: Payer: Self-pay | Admitting: Obstetrics and Gynecology

## 2018-12-05 VITALS — BP 177/96 | HR 72 | Wt 195.0 lb

## 2018-12-05 DIAGNOSIS — Z30430 Encounter for insertion of intrauterine contraceptive device: Secondary | ICD-10-CM | POA: Insufficient documentation

## 2018-12-05 DIAGNOSIS — I10 Essential (primary) hypertension: Secondary | ICD-10-CM

## 2018-12-05 LAB — POCT PREGNANCY, URINE: Preg Test, Ur: NEGATIVE

## 2018-12-05 MED ORDER — PARAGARD INTRAUTERINE COPPER IU IUD
INTRAUTERINE_SYSTEM | Freq: Once | INTRAUTERINE | Status: AC
  Administered 2018-12-05: 1 via INTRAUTERINE

## 2018-12-05 NOTE — Progress Notes (Signed)
    IUD INSERTION PROCEDURE NOTE  Shelley Young is a 38 y.o. [id number] here for Paraguard insertion. No GYN concerns.   She was counseled regarding the risks/benefits of IUD including insertion risk of infection, hemorrhage, damage to surrounding tissue and organs, uterine perforation. She was counseled regarding risks of IUD including implantation into uterine wall, migration outside of uterus, possible need for hysteroscopic or laparoscopic removal, expulsion. She was advised that risk of pregnancy is low with negative UPT but is not zero and IUD insertion may cause miscarriage. Reviewed that she is also at slightly higher risk for ectopic pregnancy and she should take a pregnancy test if she believes she may be pregnant. She was advised to use backup method of protection for one week. She verbalized understanding of all of the above and consent signed.   Last intercourse was more than 2 weeks ago. Last pap smear was on 10/2017, ASCUS, negative hrhPV UPT today: negative  IUD Insertion  Patient identified and an adequate time out was performed. Speculum placed in the vagina. The cervix was cleaned with Betadine x 2 and grasped anteriorly with a single tooth tenaculum.  A uterine sound was used to sound the uterus to 9 cm;  the IUD was then placed per manufacturer's recommendations. Strings trimmed to 3 cm. Tenaculum was removed, good hemostasis noted with application of pressure and silver nitrate. Patient tolerated procedure well.   Patient was given post-procedure instructions.  She was reminded to have backup contraception for one week during this transition period between IUDs.  Patient was also asked to check IUD strings periodically and follow up in 4 weeks for IUD check.  Paraguard IUD Exp: 09/2024 Lot: [id number]  K. Arvilla Meres, M.D. Attending [facility] [employer] farm manager)

## 2018-12-07 LAB — GC/CHLAMYDIA PROBE AMP (~~LOC~~) NOT AT ARMC
Chlamydia: NEGATIVE
Neisseria Gonorrhea: NEGATIVE

## 2018-12-13 ENCOUNTER — Ambulatory Visit: Payer: Medicaid Other | Admitting: Family Medicine

## 2018-12-13 NOTE — Telephone Encounter (Signed)
Opened in error

## 2018-12-17 DIAGNOSIS — Z029 Encounter for administrative examinations, unspecified: Secondary | ICD-10-CM

## 2018-12-25 ENCOUNTER — Encounter: Payer: Self-pay | Admitting: *Deleted

## 2019-01-02 ENCOUNTER — Other Ambulatory Visit: Payer: Self-pay

## 2019-01-02 ENCOUNTER — Ambulatory Visit (INDEPENDENT_AMBULATORY_CARE_PROVIDER_SITE_OTHER): Payer: Medicaid Other | Admitting: Obstetrics and Gynecology

## 2019-01-02 ENCOUNTER — Encounter: Payer: Self-pay | Admitting: Obstetrics and Gynecology

## 2019-01-02 VITALS — BP 164/107 | HR 83 | Temp 98.2°F | Wt 196.0 lb

## 2019-01-02 DIAGNOSIS — Z30431 Encounter for routine checking of intrauterine contraceptive device: Secondary | ICD-10-CM

## 2019-01-02 NOTE — Progress Notes (Signed)
   IUD Insertion Follow Up   Patient is 38 y.o. [id number] who is here for string check/followup after insertion of Paraguard IUD 4 weeks ago. She is doing well, had minor pain after procedure that is now resolved. She had minimal spotting after procedure. Denies pain or other issues.  BP (!) 164/107   Pulse 83   Temp 98.2 F (36.8 C)   Wt 196 lb (88.9 kg)   LMP 09/14/2018   Breastfeeding Unknown   BMI 37.03 kg/m   Gen: alert, oriented Abd: soft, non-tender GU: normal appearing vaginal mucosa, normal appearing cervix with white strings protruding 3 cm from cervix. Strings not trimmed.    Normal post IUD insertion check. Return 1 year for annual or prn    K. Arvilla Meres, M.D. Attending [facility] [employer] farm manager)

## 2019-01-07 ENCOUNTER — Telehealth: Payer: Self-pay | Admitting: *Deleted

## 2019-01-07 NOTE — Telephone Encounter (Signed)
Called pt and informed her that we have received a fax from her pharmacy requesting refill of Labetalol. Pt states that she has plenty of medication and did not request a refill. Per chart review, pt was prescribed Labetalol due to pregnancy on 10/30/18 however she had a miscarriage several days later. Pt had IUD placed on 12/05/18. I advised pt that her hypertension now needs to be managed by her PCP. She needs to call and schedule appt to see PCP within the next few weeks. More than likely, her BP medication will be changed. Pt voiced understanding and agreed to recommendations.

## 2019-12-15 IMAGING — US US OB < 14 WEEKS - US OB TV
1 series · 15 of 28 positions shown · non-contrast
Comparison: None.

CLINICAL DATA: Pink spotting since 4 p.m.. Quantitative beta HCG is
pending. Estimated gestational age by LMP is 6 weeks 4 days.

EXAM:
OBSTETRIC <14 WK US AND TRANSVAGINAL OB US
TECHNIQUE: Both transabdominal and transvaginal ultrasound examinations were
performed for complete evaluation of the gestation as well as the
maternal uterus, adnexal regions, and pelvic cul-de-sac.
Transvaginal technique was performed to assess early pregnancy.

[Series 1: us ob < 14 weeks - us ob tv · 74 acquisitions, 15 frames shown]
[im 1/74]
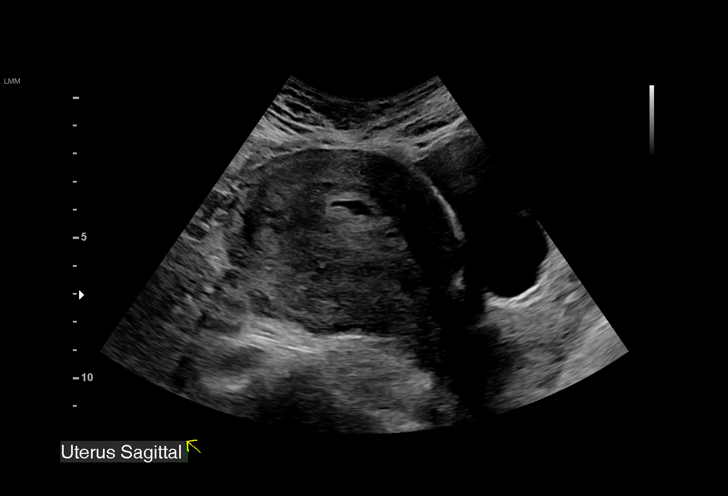
[im 6/74]
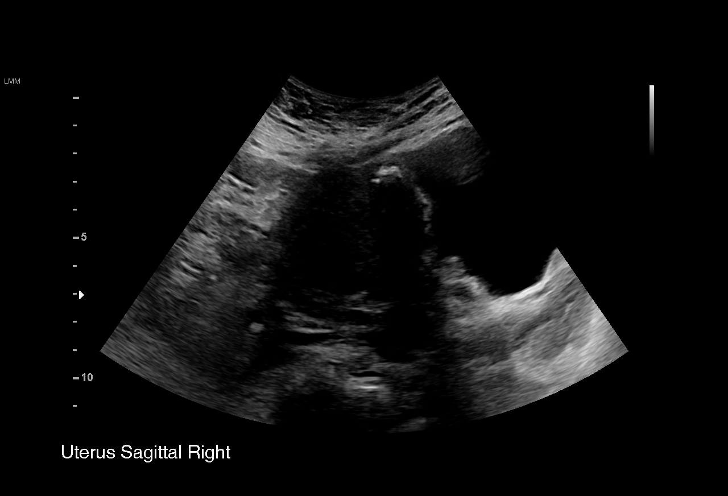
[im 11/74]
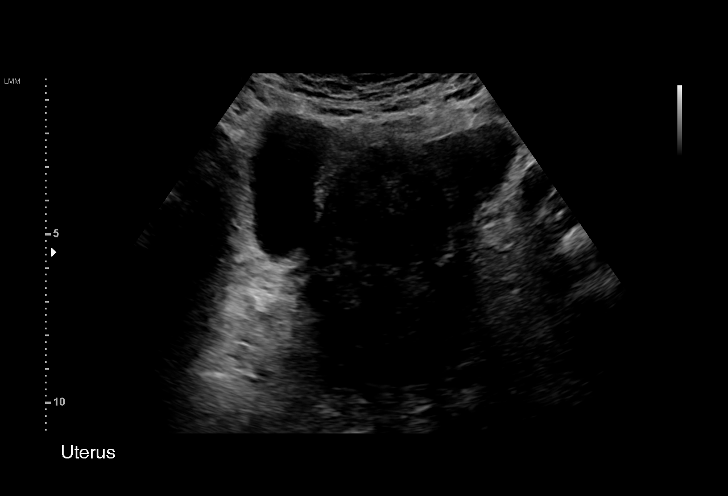
[im 17/74]
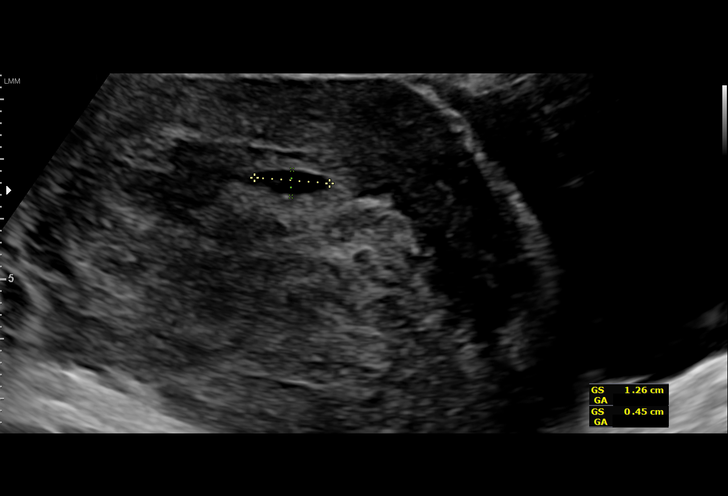
[im 22/74]
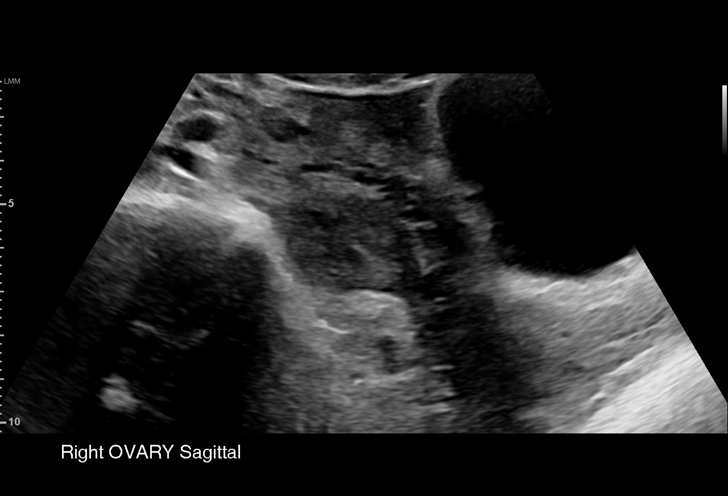
[im 28/74]
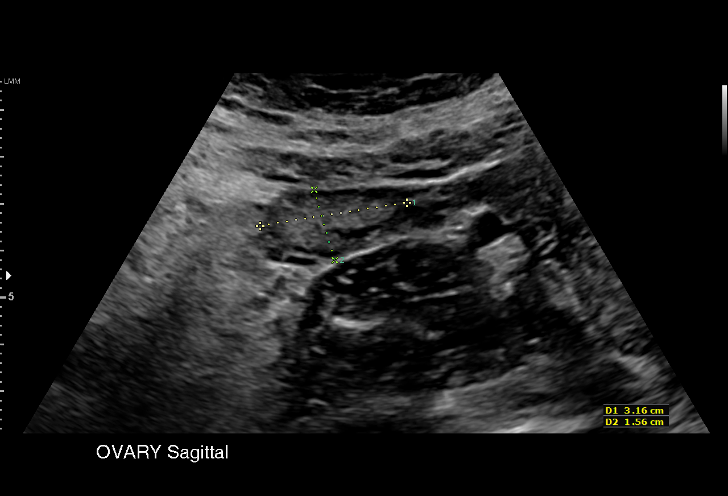
[im 33/74]
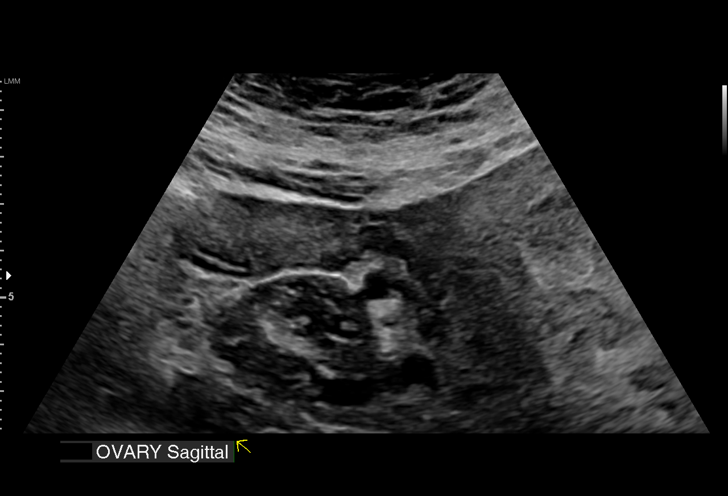
[im 38/74]
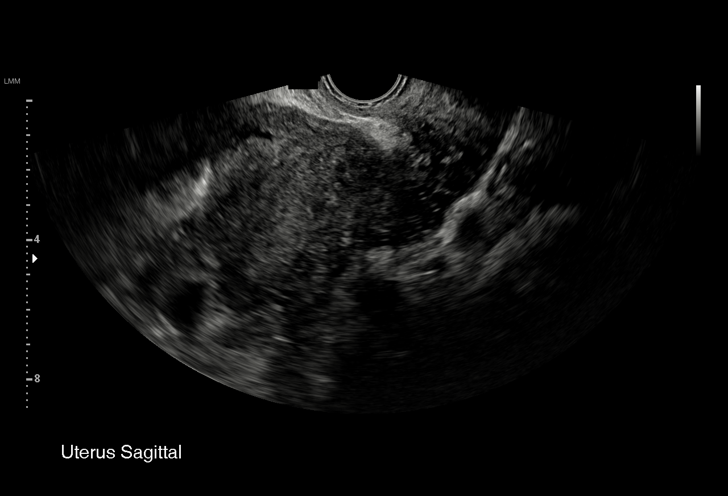
[im 41/74]
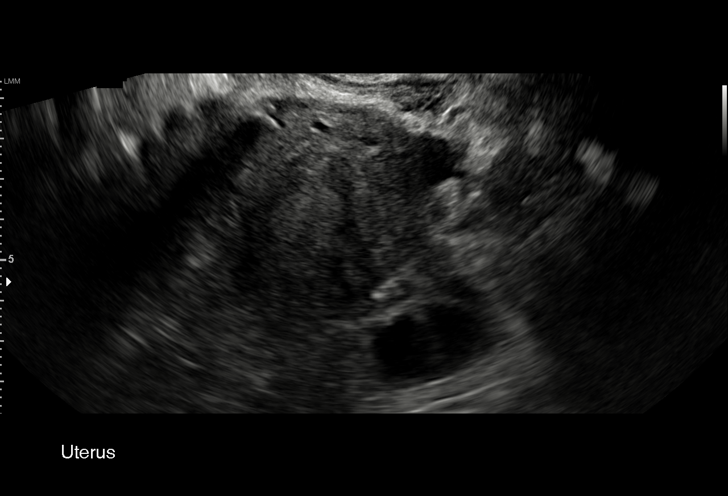
[im 46/74]
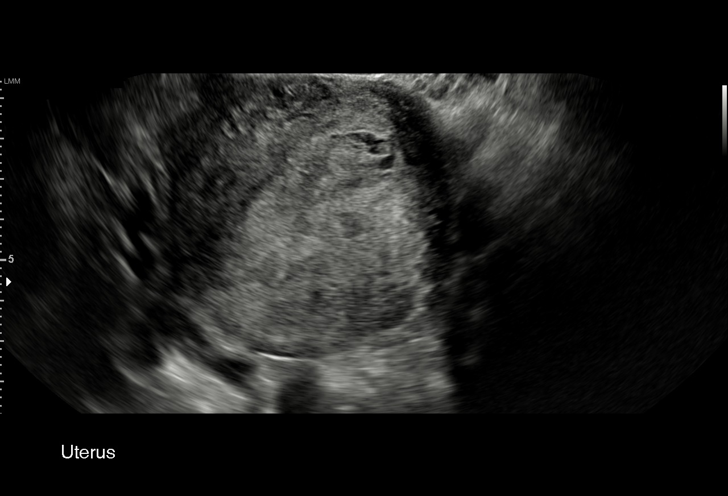
[im 52/74]
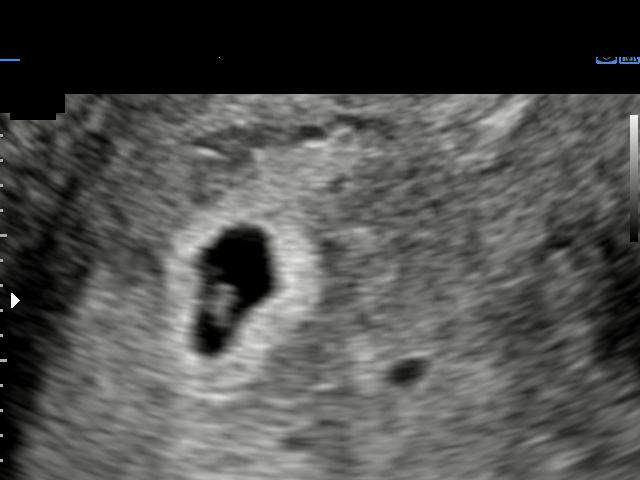
[im 57/74]
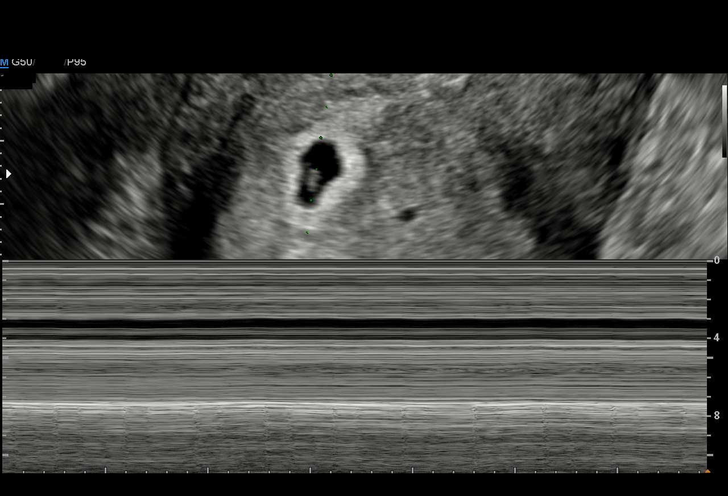
[im 63/74]
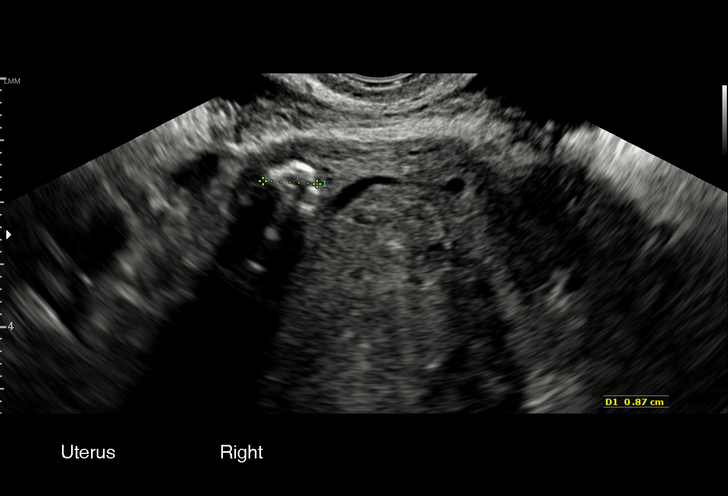
[im 68/74]
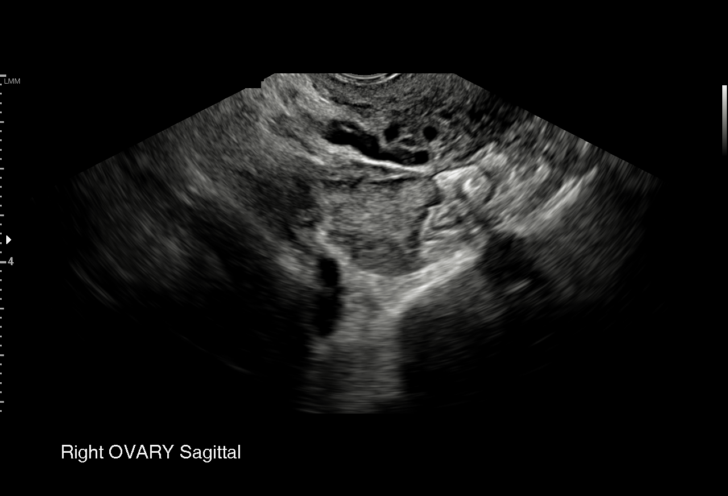
[im 74/74]
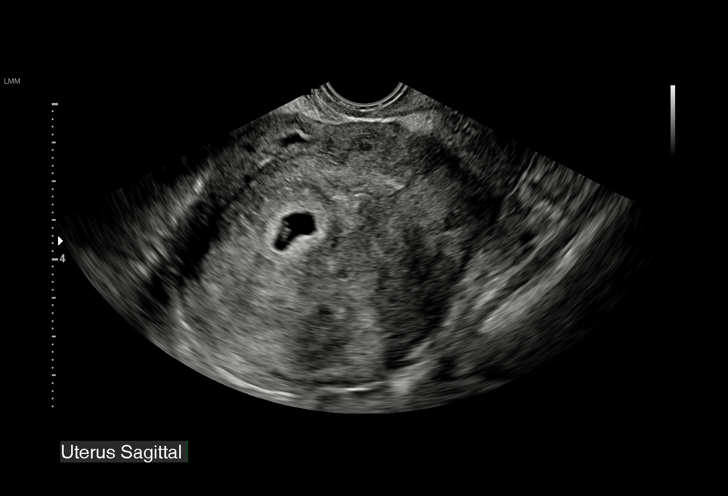

[15 of 28 positions shown; findings below may reference images not displayed]

FINDINGS: Intrauterine gestational sac: A single intrauterine gestational sac
is identified.

Yolk sac:  The yolk sac is present.

Embryo:  The fetal pole is present.

Cardiac Activity: Fetal cardiac activity is not observed.

CRL: 3.4 mm   5 w   6 d                  US EDC: 06/11/2018

Subchorionic hemorrhage:  None visualized.

Maternal uterus/adnexae: Uterus is anteverted. 1 cm calcification in
the anterior myometrium likely representing a fibroid. Both ovaries
are visualized and appear normal. No abnormal adnexal masses. No
abnormal pelvic fluid collections.
IMPRESSION: Single intrauterine pregnancy. Fetal pole is identified with
crown-rump length measuring consistent with estimated gestational
age of 5 weeks 6 days. No fetal cardiac activity is identified.
Findings are suspicious but not yet definitive for failed pregnancy.
Recommend follow-up US in 10-14 days for definitive diagnosis. This
recommendation follows SRU consensus guidelines: Diagnostic Criteria
for Nonviable Pregnancy Early in the First Trimester. N Engl J Med

## 2019-12-17 IMAGING — US US OB TRANSVAGINAL
1 series · 15 of 28 positions shown · non-contrast
Comparison: 10/14/2017

CLINICAL DATA: Heavy vaginal bleeding, clots

EXAM:
TRANSVAGINAL OB ULTRASOUND
TECHNIQUE: Transvaginal ultrasound was performed for complete evaluation of the
gestation as well as the maternal uterus, adnexal regions, and
pelvic cul-de-sac.

[Series 1: us ob transvaginal · 52 acquisitions, 15 frames shown]
[im 1/52]
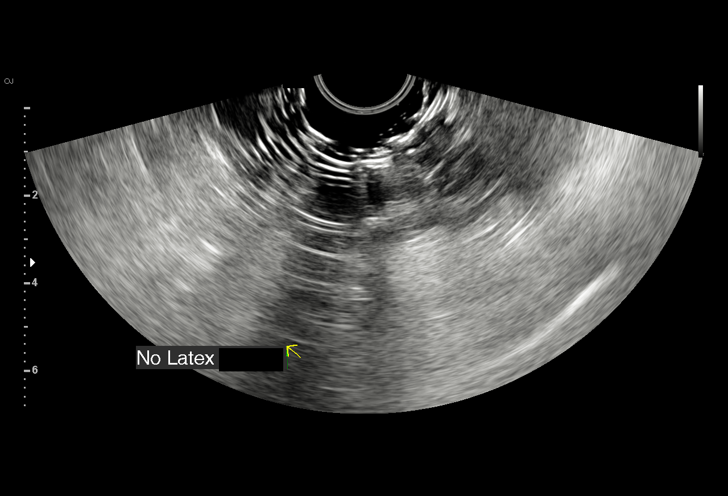
[im 4/52]
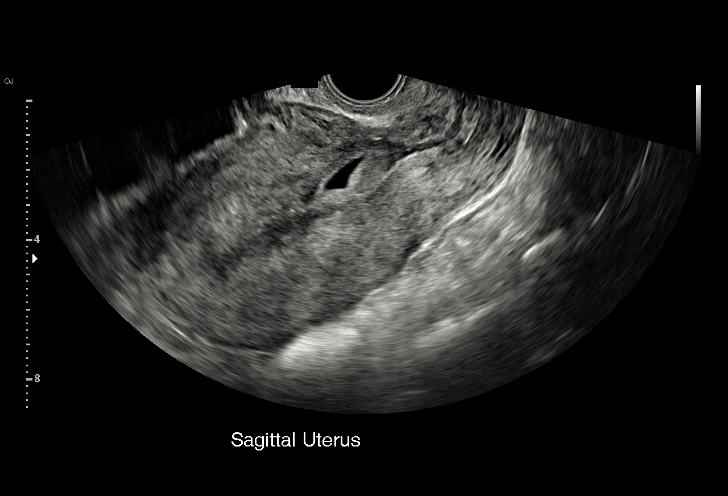
[im 8/52]
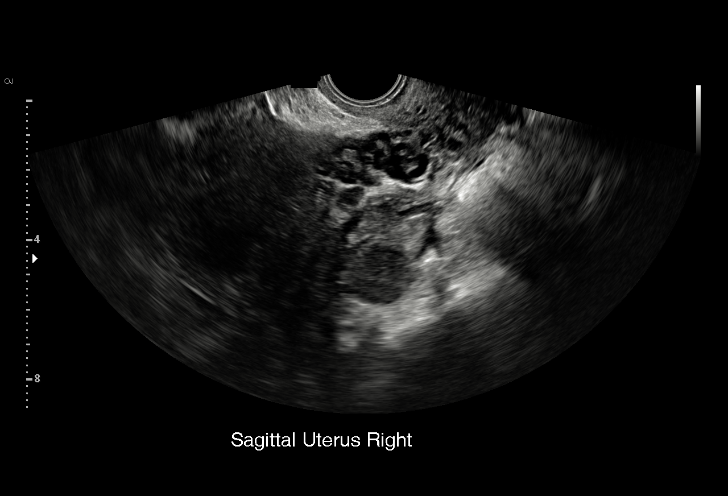
[im 12/52]
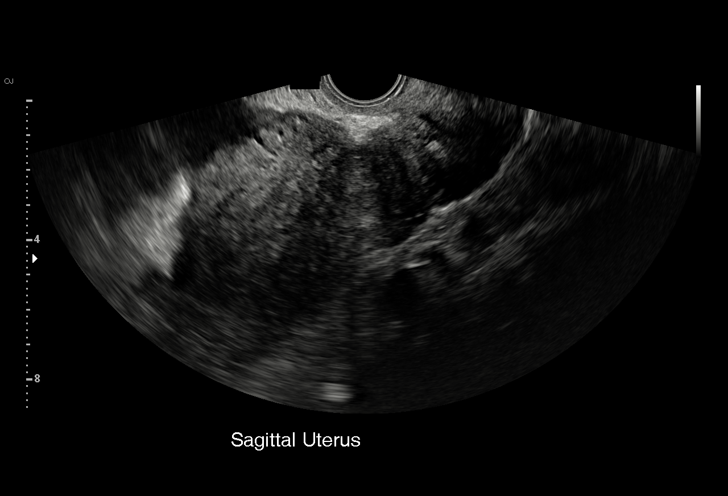
[im 16/52]
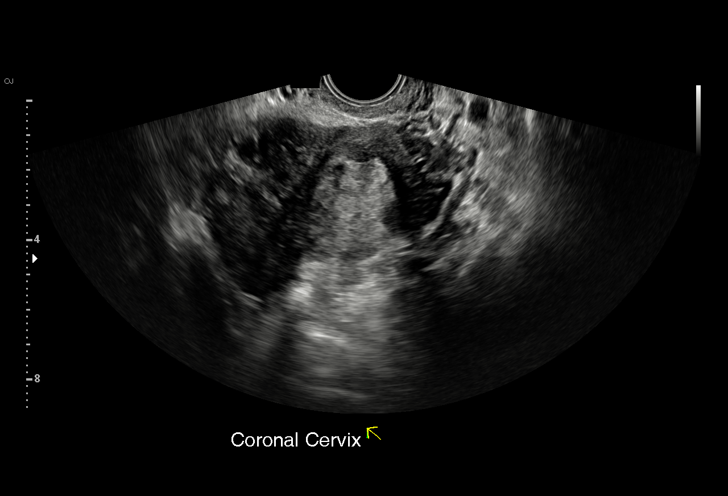
[im 19/52]
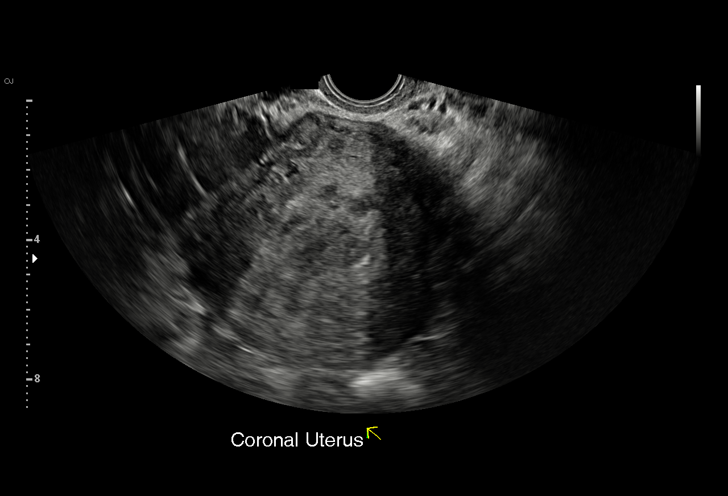
[im 23/52]
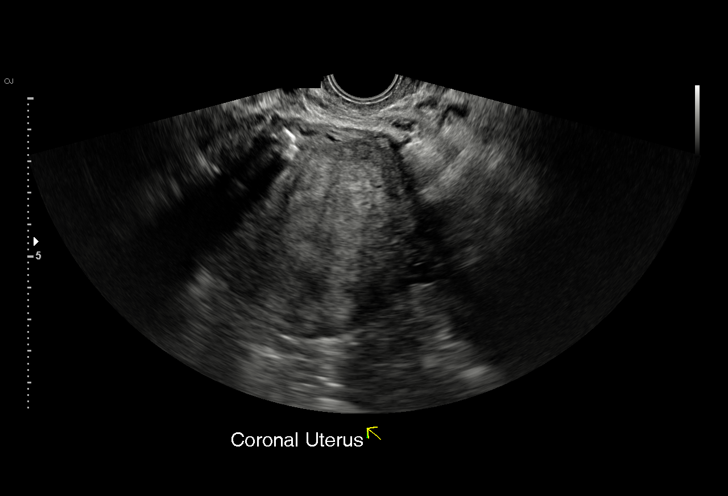
[im 27/52]
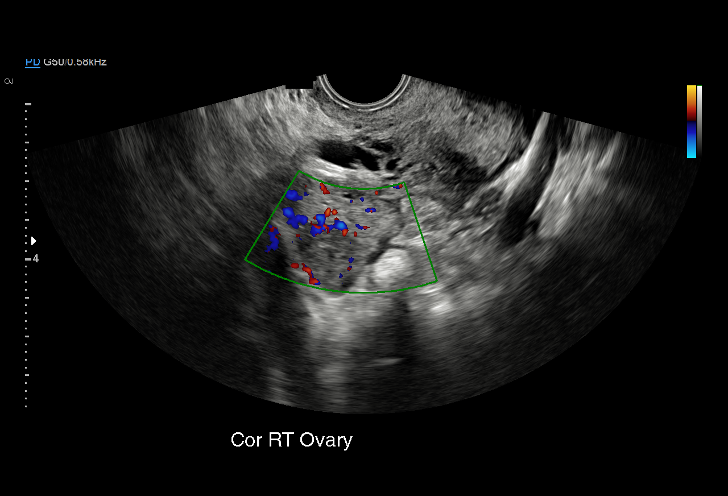
[im 29/52]
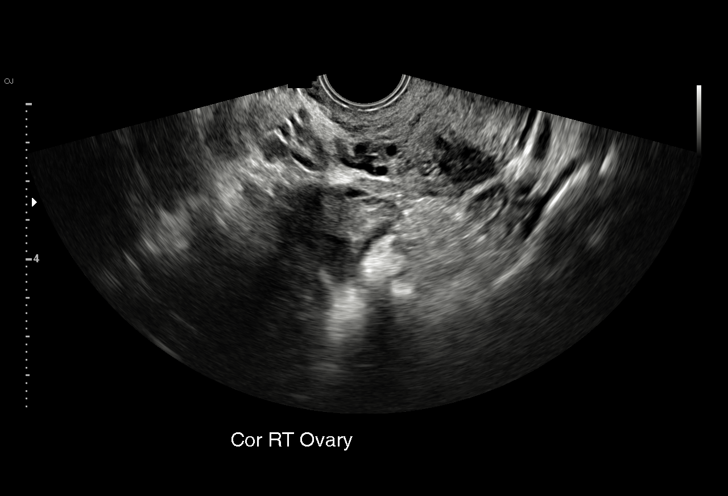
[im 33/52]
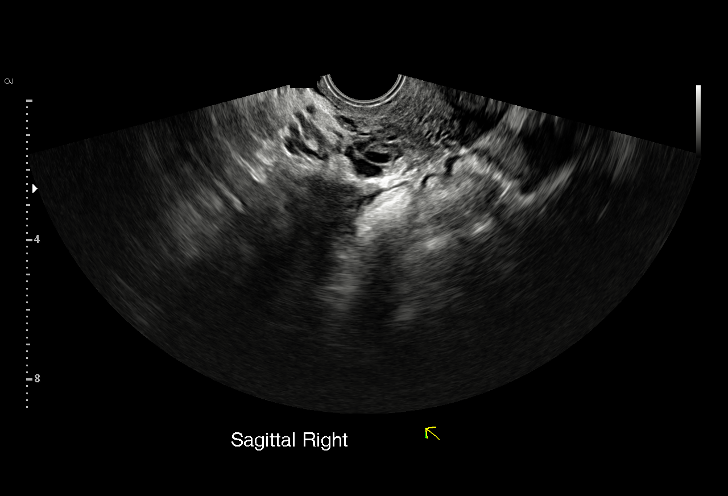
[im 36/52]
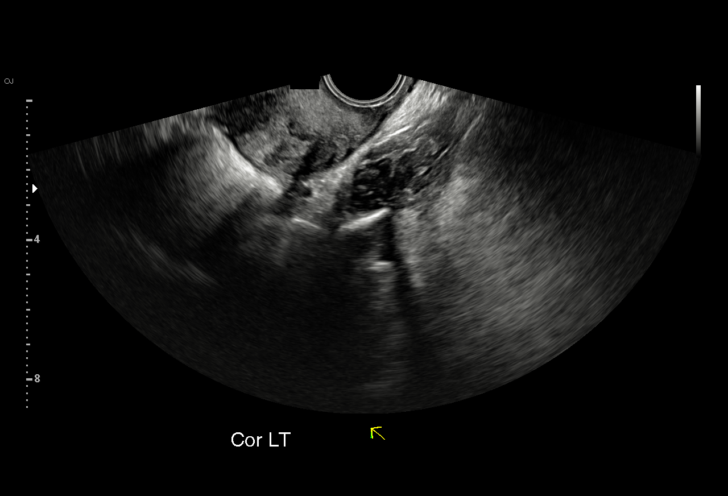
[im 40/52]
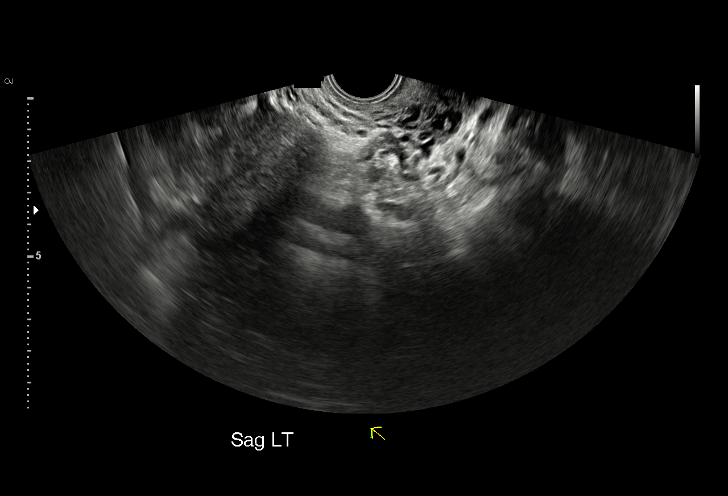
[im 44/52]
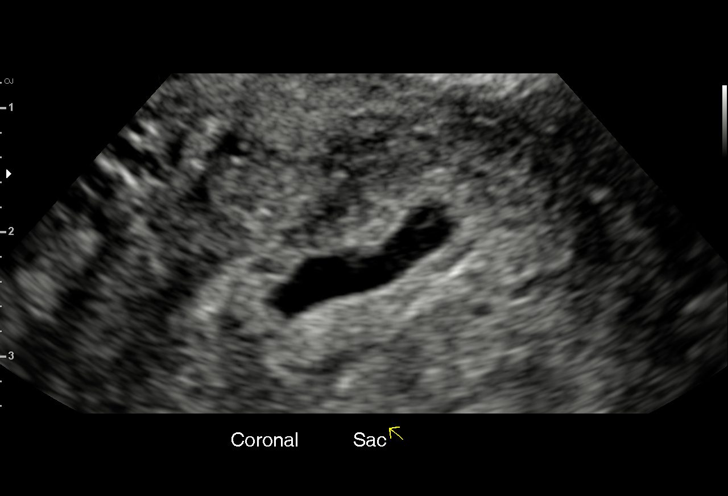
[im 48/52]
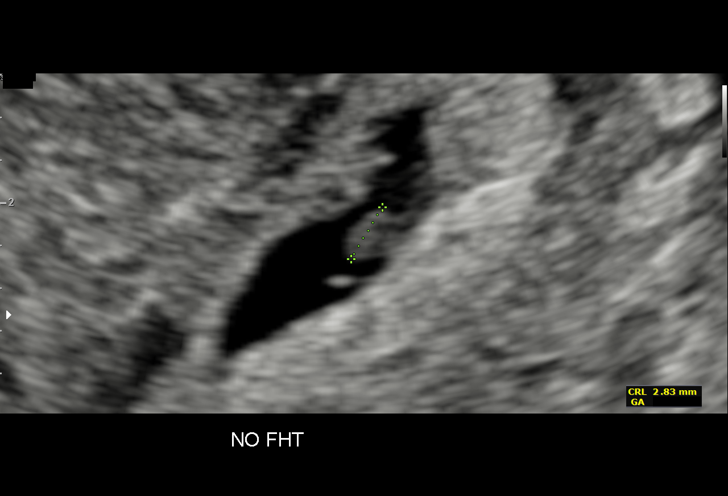
[im 52/52]
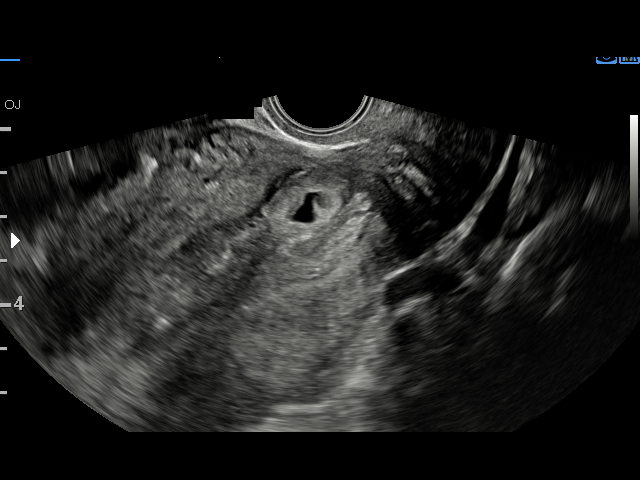

[15 of 28 positions shown; findings below may reference images not displayed]

FINDINGS: Intrauterine gestational sac: Visualized, elongated and irregularly
shaped.

Yolk sac:  Not visualized

Embryo:  Visualized

Cardiac Activity: Not visualized

Heart Rate:  bpm

MSD:   mm    w     d

CRL:   3.0 mm   5 w 5 d                  US EDC: 06/13/2018

Subchorionic hemorrhage:  New small subchorionic hemorrhage

Maternal uterus/adnexae: No adnexal masses or free fluid.
IMPRESSION: Five week 5 day intrauterine pregnancy. This previously measured 5
weeks 6 days 2 days ago. Gestational sac is somewhat elongated and
irregular. No fetal heart tones detectable. Recommend continued
close follow-up. Repeat ultrasound in 10-14 days could be performed
to assess progression.

New small subchorionic hemorrhage.

## 2019-12-19 DIAGNOSIS — Z419 Encounter for procedure for purposes other than remedying health state, unspecified: Secondary | ICD-10-CM | POA: Diagnosis not present

## 2020-01-19 DIAGNOSIS — Z419 Encounter for procedure for purposes other than remedying health state, unspecified: Secondary | ICD-10-CM | POA: Diagnosis not present

## 2020-02-03 ENCOUNTER — Ambulatory Visit (INDEPENDENT_AMBULATORY_CARE_PROVIDER_SITE_OTHER): Payer: Medicaid Other | Admitting: Obstetrics and Gynecology

## 2020-02-03 ENCOUNTER — Other Ambulatory Visit (HOSPITAL_COMMUNITY)
Admission: RE | Admit: 2020-02-03 | Discharge: 2020-02-03 | Disposition: A | Discharge status: Home / Self Care | Payer: Medicaid Other | Source: Ambulatory Visit | Attending: Obstetrics and Gynecology | Admitting: Obstetrics and Gynecology

## 2020-02-03 ENCOUNTER — Encounter: Payer: Self-pay | Admitting: Obstetrics and Gynecology

## 2020-02-03 ENCOUNTER — Other Ambulatory Visit: Payer: Self-pay

## 2020-02-03 VITALS — BP 124/80 | HR 79 | Ht 61.0 in | Wt 192.0 lb

## 2020-02-03 DIAGNOSIS — Z1231 Encounter for screening mammogram for malignant neoplasm of breast: Secondary | ICD-10-CM

## 2020-02-03 DIAGNOSIS — L83 Acanthosis nigricans: Secondary | ICD-10-CM

## 2020-02-03 DIAGNOSIS — Z124 Encounter for screening for malignant neoplasm of cervix: Secondary | ICD-10-CM | POA: Insufficient documentation

## 2020-02-03 DIAGNOSIS — Z01419 Encounter for gynecological examination (general) (routine) without abnormal findings: Secondary | ICD-10-CM | POA: Diagnosis not present

## 2020-02-03 DIAGNOSIS — Z30431 Encounter for routine checking of intrauterine contraceptive device: Secondary | ICD-10-CM

## 2020-02-03 DIAGNOSIS — Z113 Encounter for screening for infections with a predominantly sexual mode of transmission: Secondary | ICD-10-CM | POA: Insufficient documentation

## 2020-02-03 NOTE — Progress Notes (Signed)
GYNECOLOGY ANNUAL PREVENTATIVE CARE ENCOUNTER NOTE  Subjective:   Shelley Young is a 39 y.o. [id number] female here for a annual gynecologic exam. Current complaints: none. Having regular monthly periods, heavy for 3 days and then stops. IUD in place, no issues.    Denies abnormal vaginal bleeding, discharge, pelvic pain, problems with intercourse or other gynecologic concerns. Accepts STI screen.   Gynecologic History Patient's last menstrual period was 01/23/2020 (exact date). Contraception: IUD Last Pap: 10/2017. Results: ASCUS Last mammogram: n/a DEXA: has never had  Obstetric History OB History  Gravida Para Term Preterm AB Living  12 3 1 2 9 3   SAB TAB Ectopic Multiple Live Births  9       3    # Outcome Date GA Lbr Len/2nd Weight Sex Delivery Anes PTL Lv  12 SAB 11/03/18 [redacted]w[redacted]d         11 SAB 09/2017 [redacted]w[redacted]d         10 SAB 11/03/14 [redacted]w[redacted]d         9 Preterm 2011    M CS-LTranv  Y LIV  8 SAB 10/31/07 [redacted]w[redacted]d         7 Preterm 08/18/05 [redacted]w[redacted]d   M CS-LTranv  Y LIV     Complications: Breech birth, Severe preeclampsia, third trimester  6 Term 12/13/02 [redacted]w[redacted]d   M CS-LTranv  Y LIV     Complications: Breech birth  41 SAB 06/26/01          4 SAB 2003          3 SAB 10/19/00          2 SAB 07/10/99          1 SAB 01/01/99            Past Medical History:  Diagnosis Date   Hypertension    Miscarriage    x6    Past Surgical History:  Procedure Laterality Date   CESAREAN SECTION     X 3    Current Outpatient Medications on File Prior to Visit  Medication Sig Dispense Refill   amLODipine (NORVASC) 10 MG tablet Take 10 mg by mouth daily.     lisinopril-hydrochlorothiazide (ZESTORETIC) 10-12.5 MG tablet Take 1 tablet by mouth daily.     No current facility-administered medications on file prior to visit.    No Known Allergies  Social History   Socioeconomic History   Marital status: Divorced    Spouse name: Not on file   Number of children: Not on file    Years of education: Not on file   Highest education level: Not on file  Occupational History   Not on file  Tobacco Use   Smoking status: Never Smoker   Smokeless tobacco: Never Used  Vaping Use   Vaping Use: Never used  Substance and Sexual Activity   Alcohol use: No   Drug use: No   Sexual activity: Yes    Birth control/protection: None    Comment: last sex one month ago  Other Topics Concern   Not on file  Social History Narrative   Not on file   Social Determinants of Health   Financial Resource Strain:    Difficulty of Paying Living Expenses:   Food Insecurity: No Food Insecurity   Worried About [place] in the Last Year: Never true   [place] in the Last Year: Never true  Transportation Needs: No Transportation Needs   Lack of Transportation (Medical):  No   Lack of Transportation (Non-Medical): No  Physical Activity:    Days of Exercise per Week:    Minutes of Exercise per Session:   Stress:    Feeling of Stress :   Social Connections:    Frequency of Communication with Friends and Family:    Frequency of Social Gatherings with Friends and Family:    Attends Religious Services:    Active Member of Clubs or Organizations:    Attends Music therapist:    Marital Status:   Intimate Partner Violence:    Fear of Current or Ex-Partner:    Emotionally Abused:    Physically Abused:    Sexually Abused:     Family History  Problem Relation Age of Onset   Hypertension Mother     The following portions of the patient's history were reviewed and updated as appropriate: allergies, current medications, past family history, past medical history, past social history, past surgical history and problem list.  Review of Systems Pertinent items are noted in HPI.   Objective:  BP 124/80    Pulse 79    Ht 5\' 1"  (1.549 m)    Wt 192 lb (87.1 kg)    LMP 01/23/2020 (Exact Date)    BMI 36.28 kg/m  CONSTITUTIONAL:  Well-developed, well-nourished female in no acute distress.  HENT:  Normocephalic, atraumatic, External right and left ear normal. Oropharynx is clear and moist EYES: Conjunctivae and EOM are normal. Pupils are equal, round, and reactive to light. No scleral icterus.  NECK: Normal range of motion, supple, no masses.  Normal thyroid. Acanthosis on neck SKIN: Skin is warm and dry. No rash noted. Not diaphoretic. No erythema. No pallor. NEUROLOGIC: Alert and oriented to person, place, and time. Normal reflexes, muscle tone coordination. No cranial nerve deficit noted. PSYCHIATRIC: Normal mood and affect. Normal behavior. Normal judgment and thought content. CARDIOVASCULAR: Normal heart rate noted, regular rhythm RESPIRATORY: Clear to auscultation bilaterally. Effort and breath sounds normal, no problems with respiration noted. BREASTS: Symmetric in size. No masses, skin changes, nipple drainage, or lymphadenopathy. ABDOMEN: Soft, no distention noted.  No tenderness, rebound or guarding.  PELVIC: Normal appearing external genitalia; normal appearing vaginal mucosa and cervix.  No abnormal discharge noted. White IUD strings visible from os. Pap smear obtained. Pelvic cultures obtained. Normal uterine size, no other palpable masses, no uterine or adnexal tenderness. MUSCULOSKELETAL: Normal range of motion. No tenderness.  No cyanosis, clubbing, or edema.  2+ distal pulses.  Exam done with chaperone present.   Assessment and Plan:   1. Well woman exam Healthy female exam  2. Cervical cancer screening - Cytology - PAP( [place])  3. Encounter for screening mammogram for malignant neoplasm of breast - MM 3D SCREEN BREAST BILATERAL; Future  4. IUD check up Strings visible  5. Routine screening for STI (sexually transmitted infection) - Hepatitis B surface antigen - Hepatitis C antibody - HIV Antibody (routine testing w rflx) - RPR - Cervicovaginal ancillary only( [place])  6.  Acanthosis nigricans - Lipid panel - CBC - Basic metabolic panel - HgB N8G - TSH   Will follow up results of pap smear/STI screen and manage accordingly. Encouraged improvement in diet and exercise.  COVID vaccine UTD Accepts STI screen. Mammogram ordered today Referral for colonoscopy n/a DEXA not due based on age  Routine preventative health maintenance measures emphasized. Please refer to After Visit Summary for other counseling recommendations.   Total face-to-face time with patient: 30 minutes. Over  50% of encounter was spent on counseling and coordination of care.   Feliz Beam, M.D. Attending Center for [employer])

## 2020-02-04 LAB — HEMOGLOBIN A1C
Est. average glucose Bld gHb Est-mCnc: 111 mg/dL
Hgb A1c MFr Bld: 5.5 % (ref 4.8–5.6)

## 2020-02-04 LAB — CBC
Hematocrit: 36.7 % (ref 34.0–46.6)
Hemoglobin: 10.7 g/dL — ABNORMAL LOW (ref 11.1–15.9)
MCH: 22.9 pg — ABNORMAL LOW (ref 26.6–33.0)
MCHC: 29.2 g/dL — ABNORMAL LOW (ref 31.5–35.7)
MCV: 78 fL — ABNORMAL LOW (ref 79–97)
Platelets: 356 10*3/uL (ref 150–450)
RBC: 4.68 x10E6/uL (ref 3.77–5.28)
RDW: 14 % (ref 11.7–15.4)
WBC: 6.6 10*3/uL (ref 3.4–10.8)

## 2020-02-04 LAB — TSH: TSH: 0.952 u[IU]/mL (ref 0.450–4.500)

## 2020-02-04 LAB — BASIC METABOLIC PANEL
BUN/Creatinine Ratio: 20 (ref 9–23)
BUN: 13 mg/dL (ref 6–24)
CO2: 28 mmol/L (ref 20–29)
Calcium: 9.5 mg/dL (ref 8.7–10.2)
Chloride: 102 mmol/L (ref 96–106)
Creatinine, Ser: 0.66 mg/dL (ref 0.57–1.00)
GFR calc Af Amer: 128 mL/min/{1.73_m2} (ref >59)
GFR calc non Af Amer: 111 mL/min/{1.73_m2} (ref >59)
Glucose: 86 mg/dL (ref 65–99)
Potassium: 3.6 mmol/L (ref 3.5–5.2)
Sodium: 140 mmol/L (ref 134–144)

## 2020-02-04 LAB — HEPATITIS B SURFACE ANTIGEN: Hepatitis B Surface Ag: NEGATIVE

## 2020-02-04 LAB — LIPID PANEL
Chol/HDL Ratio: 3.4 ratio (ref 0.0–4.4)
Cholesterol, Total: 185 mg/dL (ref 100–199)
HDL: 54 mg/dL (ref >39)
LDL Chol Calc (NIH): 106 mg/dL — ABNORMAL HIGH (ref 0–99)
Triglycerides: 141 mg/dL (ref 0–149)
VLDL Cholesterol Cal: 25 mg/dL (ref 5–40)

## 2020-02-04 LAB — HEPATITIS C ANTIBODY: Hep C Virus Ab: <0.1 s/co ratio (ref 0.0–0.9)

## 2020-02-04 LAB — RPR: RPR Ser Ql: NONREACTIVE

## 2020-02-04 LAB — CERVICOVAGINAL ANCILLARY ONLY
Chlamydia: NEGATIVE
Comment: NEGATIVE
Comment: NEGATIVE
Comment: NORMAL
Neisseria Gonorrhea: NEGATIVE
Trichomonas: NEGATIVE

## 2020-02-04 LAB — HIV ANTIBODY (ROUTINE TESTING W REFLEX): HIV Screen 4th Generation wRfx: NONREACTIVE

## 2020-02-06 LAB — CYTOLOGY - PAP
Comment: NEGATIVE
Diagnosis: NEGATIVE
Diagnosis: REACTIVE
High risk HPV: NEGATIVE

## 2020-02-19 DIAGNOSIS — Z419 Encounter for procedure for purposes other than remedying health state, unspecified: Secondary | ICD-10-CM | POA: Diagnosis not present

## 2020-03-02 ENCOUNTER — Ambulatory Visit: Payer: Medicaid Other

## 2020-03-20 DIAGNOSIS — Z419 Encounter for procedure for purposes other than remedying health state, unspecified: Secondary | ICD-10-CM | POA: Diagnosis not present

## 2020-04-06 ENCOUNTER — Ambulatory Visit
Admission: RE | Admit: 2020-04-06 | Discharge: 2020-04-06 | Disposition: A | Discharge status: Home / Self Care | Payer: Medicaid Other | Source: Ambulatory Visit | Attending: Obstetrics and Gynecology | Admitting: Obstetrics and Gynecology

## 2020-04-06 ENCOUNTER — Other Ambulatory Visit: Payer: Self-pay

## 2020-04-06 DIAGNOSIS — Z1231 Encounter for screening mammogram for malignant neoplasm of breast: Secondary | ICD-10-CM

## 2020-04-09 ENCOUNTER — Other Ambulatory Visit: Payer: Self-pay | Admitting: Obstetrics and Gynecology

## 2020-04-09 DIAGNOSIS — R928 Other abnormal and inconclusive findings on diagnostic imaging of breast: Secondary | ICD-10-CM

## 2020-04-20 DIAGNOSIS — Z419 Encounter for procedure for purposes other than remedying health state, unspecified: Secondary | ICD-10-CM | POA: Diagnosis not present

## 2020-04-27 ENCOUNTER — Ambulatory Visit: Payer: Medicaid Other

## 2020-04-27 ENCOUNTER — Other Ambulatory Visit: Payer: Self-pay

## 2020-04-27 ENCOUNTER — Ambulatory Visit
Admission: RE | Admit: 2020-04-27 | Discharge: 2020-04-27 | Disposition: A | Discharge status: Home / Self Care | Payer: Medicaid Other | Source: Ambulatory Visit | Attending: Obstetrics and Gynecology | Admitting: Obstetrics and Gynecology

## 2020-04-27 DIAGNOSIS — R928 Other abnormal and inconclusive findings on diagnostic imaging of breast: Secondary | ICD-10-CM | POA: Diagnosis not present

## 2020-05-20 DIAGNOSIS — Z419 Encounter for procedure for purposes other than remedying health state, unspecified: Secondary | ICD-10-CM | POA: Diagnosis not present

## 2020-06-20 DIAGNOSIS — Z419 Encounter for procedure for purposes other than remedying health state, unspecified: Secondary | ICD-10-CM | POA: Diagnosis not present

## 2020-07-21 DIAGNOSIS — Z419 Encounter for procedure for purposes other than remedying health state, unspecified: Secondary | ICD-10-CM | POA: Diagnosis not present

## 2020-08-18 DIAGNOSIS — Z419 Encounter for procedure for purposes other than remedying health state, unspecified: Secondary | ICD-10-CM | POA: Diagnosis not present

## 2020-09-18 DIAGNOSIS — Z419 Encounter for procedure for purposes other than remedying health state, unspecified: Secondary | ICD-10-CM | POA: Diagnosis not present

## 2020-10-18 DIAGNOSIS — Z419 Encounter for procedure for purposes other than remedying health state, unspecified: Secondary | ICD-10-CM | POA: Diagnosis not present

## 2020-11-18 DIAGNOSIS — Z419 Encounter for procedure for purposes other than remedying health state, unspecified: Secondary | ICD-10-CM | POA: Diagnosis not present

## 2020-12-18 DIAGNOSIS — Z419 Encounter for procedure for purposes other than remedying health state, unspecified: Secondary | ICD-10-CM | POA: Diagnosis not present

## 2021-01-01 IMAGING — US OBSTETRIC <14 WK US AND TRANSVAGINAL OB US
1 series · 15 of 28 positions shown · non-contrast
Comparison: None.

CLINICAL DATA: Vaginal bleeding

EXAM:
OBSTETRIC <14 WK US AND TRANSVAGINAL OB US
TECHNIQUE: Both transabdominal and transvaginal ultrasound examinations were
performed for complete evaluation of the gestation as well as the
maternal uterus, adnexal regions, and pelvic cul-de-sac.
Transvaginal technique was performed to assess early pregnancy.

[Series 1: obstetric <14 wk us and transvaginal ob us · 15 of 52 slices shown]
[im 1/52]
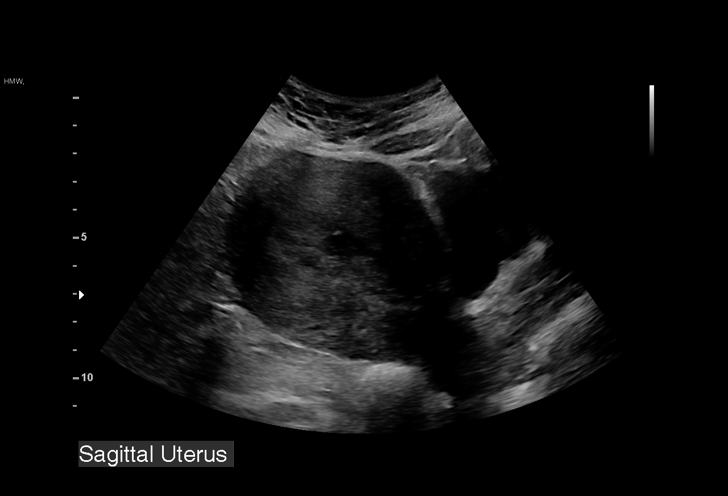
[im 4/52]
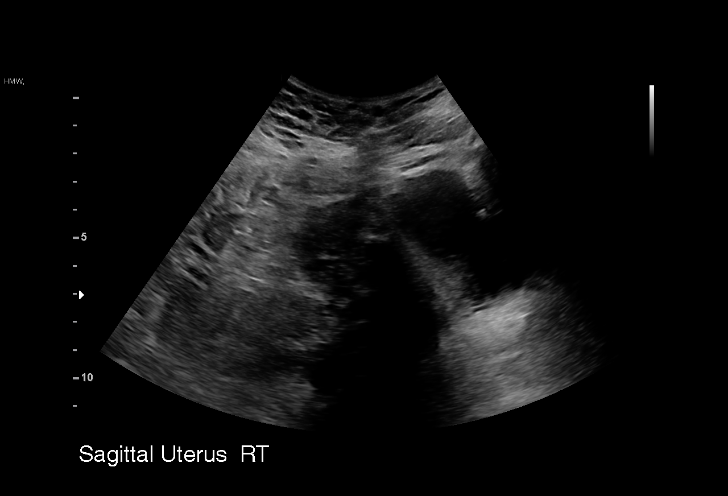
[im 8/52]
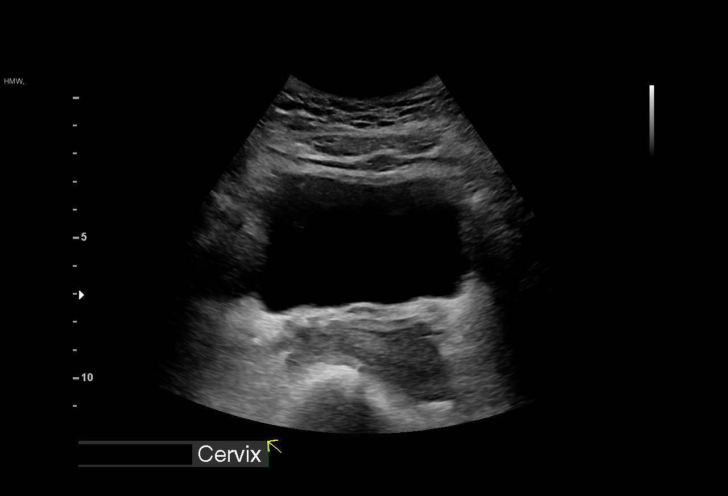
[im 12/52]
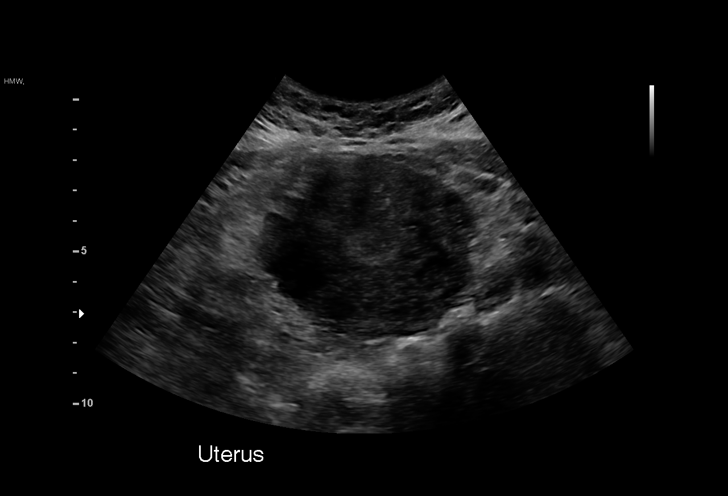
[im 16/52]
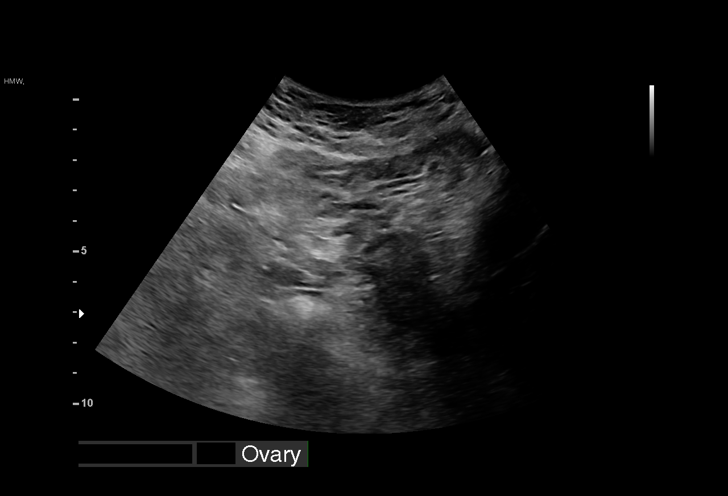
[im 19/52]
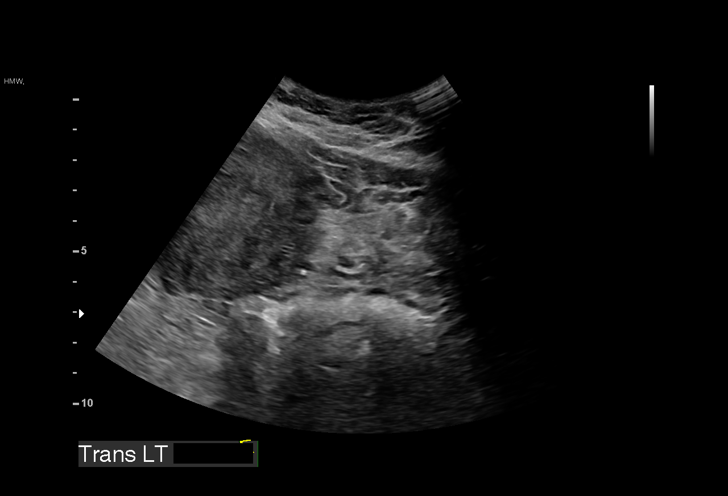
[im 23/52]
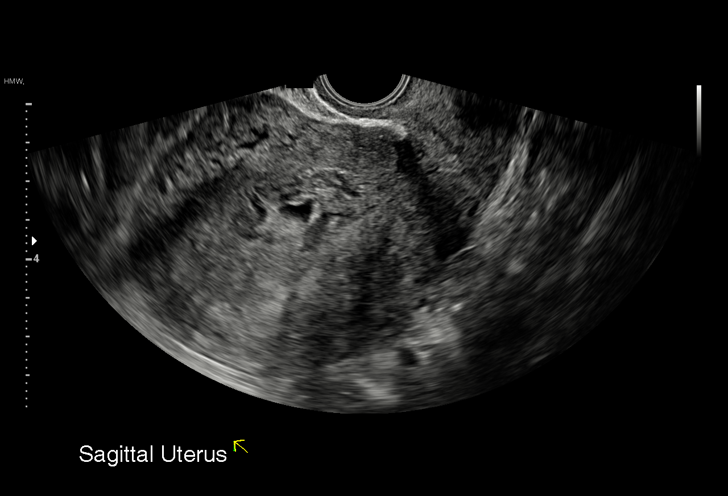
[im 27/52]
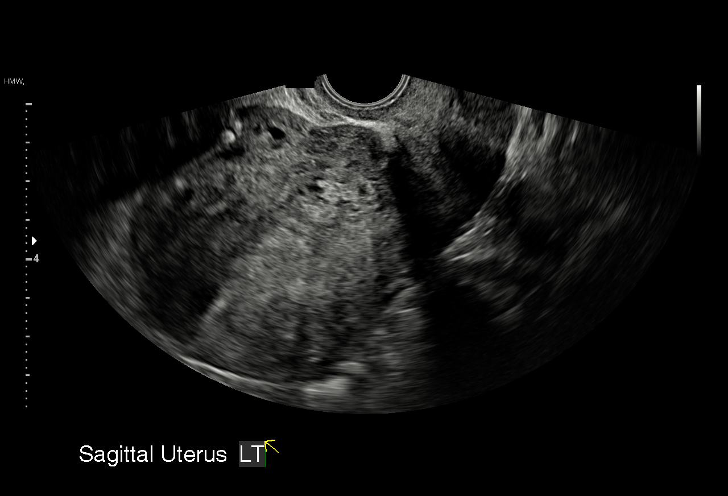
[im 29/52]
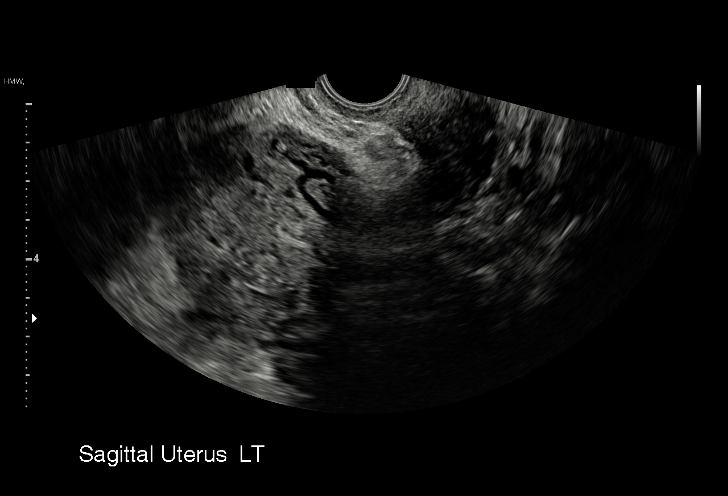
[im 33/52]
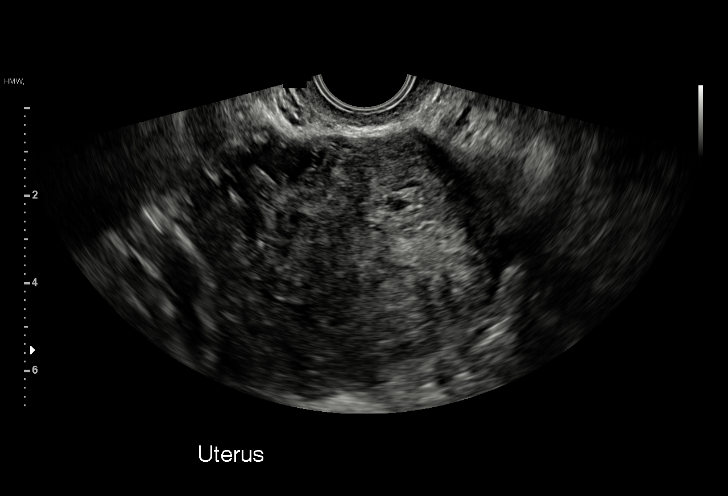
[im 36/52]
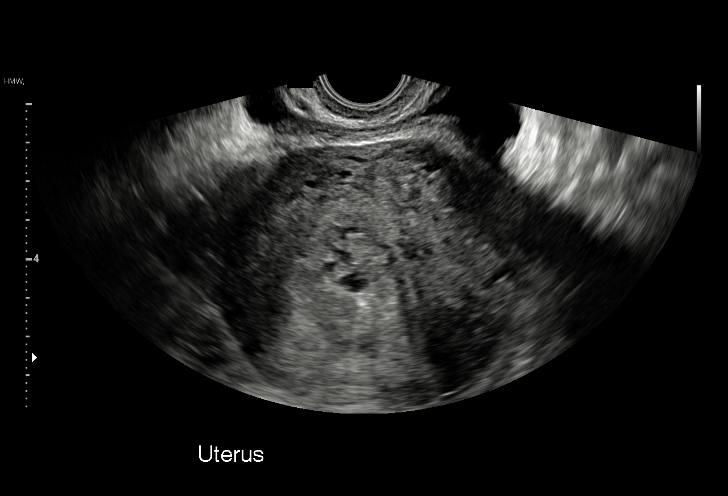
[im 40/52]
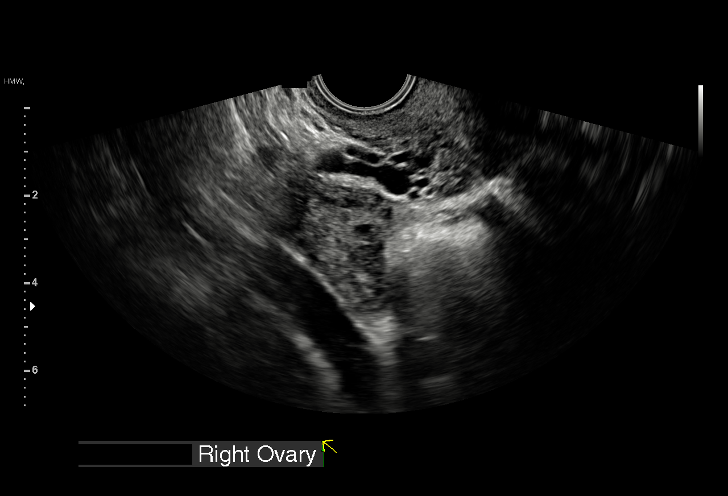
[im 44/52]
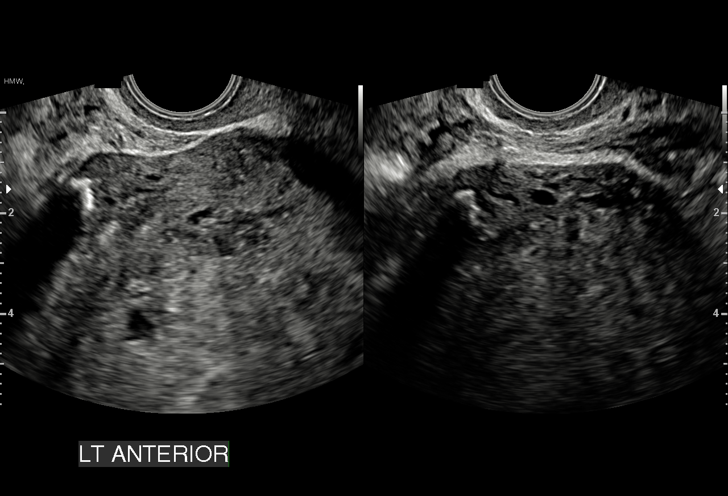
[im 48/52]
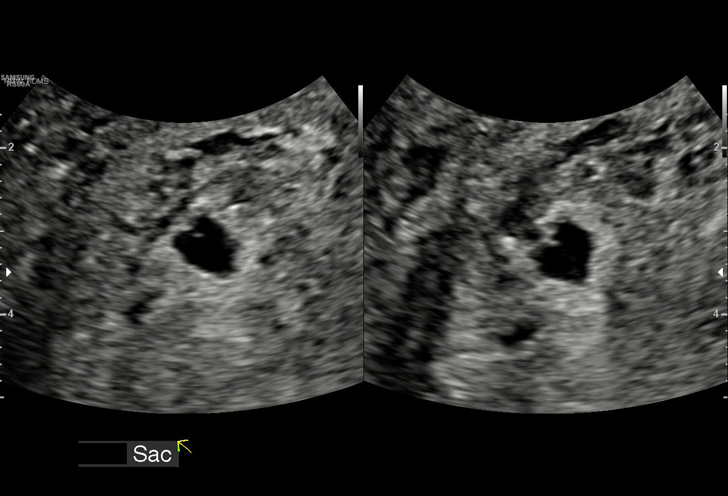
[im 52/52]
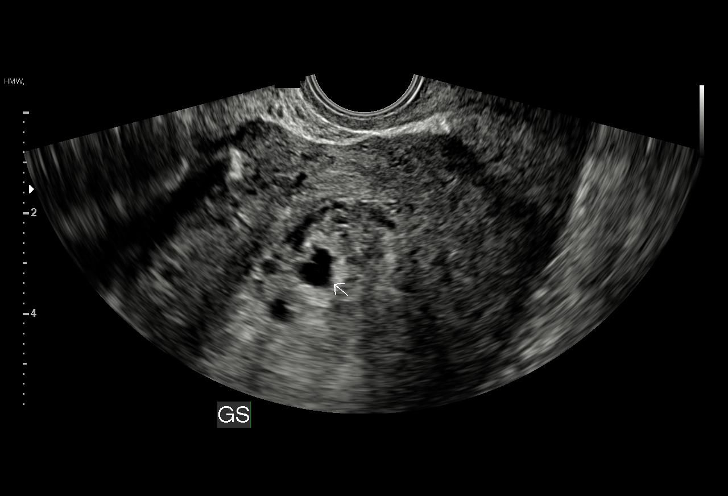

[15 of 28 positions shown; findings below may reference images not displayed]

FINDINGS: Intrauterine gestational sac: Visualized

Yolk sac:  Questionable

Embryo:  Not visualized

Cardiac Activity: Not visualized

MSD: 7 mm   5 w   3 d

Subchorionic hemorrhage: There is a 6 x 5 mm focus of subchorionic
hemorrhage.

Maternal uterus/adnexae: There is a calcified leiomyoma measuring
1.5 x 1.3 x 1.4 cm in the anterior leftward aspect of the uterus.
Cervical os is closed. Right ovary measures 3.2 x 1.9 x 2.1 cm. Left
ovary measures 3.6 x 2.6 x 1.8 cm. No extrauterine pelvic mass. No
free pelvic fluid.
IMPRESSION: Probable early intrauterine gestational sac, but no fetal pole or
cardiac activity yet visualized. Questionable yolk sac appreciable,
less than optimally visualized. Recommend follow-up quantitative
B-HCG levels and follow-up US in 14 days to assess viability. This
recommendation follows SRU consensus guidelines: Diagnostic Criteria
for Nonviable Pregnancy Early in the First Trimester. N Engl J Med
5965; [DATE]. based on gestational sac size, estimated
gestational age is 5+ weeks.

There is a 6 x 5 mm subchorionic hemorrhage. There is a 1.5 x 1.3 x
1.4 cm partially calcified leiomyoma within the uterus anteriorly.
No extrauterine pelvic or adnexal mass evident.

## 2021-01-18 DIAGNOSIS — Z419 Encounter for procedure for purposes other than remedying health state, unspecified: Secondary | ICD-10-CM | POA: Diagnosis not present

## 2021-02-18 DIAGNOSIS — Z419 Encounter for procedure for purposes other than remedying health state, unspecified: Secondary | ICD-10-CM | POA: Diagnosis not present

## 2021-03-15 ENCOUNTER — Ambulatory Visit (INDEPENDENT_AMBULATORY_CARE_PROVIDER_SITE_OTHER): Payer: Medicaid Other | Admitting: Obstetrics and Gynecology

## 2021-03-15 ENCOUNTER — Other Ambulatory Visit: Payer: Self-pay

## 2021-03-15 ENCOUNTER — Encounter: Payer: Self-pay | Admitting: Obstetrics and Gynecology

## 2021-03-15 VITALS — BP 177/98 | HR 78 | Wt 194.0 lb

## 2021-03-15 DIAGNOSIS — Z30431 Encounter for routine checking of intrauterine contraceptive device: Secondary | ICD-10-CM

## 2021-03-15 DIAGNOSIS — Z1231 Encounter for screening mammogram for malignant neoplasm of breast: Secondary | ICD-10-CM | POA: Diagnosis not present

## 2021-03-15 NOTE — Progress Notes (Signed)
Obstetrics and Gynecology Visit Return Patient Evaluation  Appointment Date: 03/15/2021  Primary Care Provider: patient unsure of name but states it's on [facility]: [facility]  Chief Complaint: patient wanting to make sure IUD is fine  History of Present Illness:  Shelley Young is a 40 y.o. s/p June 2020 paragard IUD insertion. She is doing well and is having regular, monthly periods that are somewhat heavy for about 3 days but not painful and no vaginal discharge, itching or AUB. Pt is happy with the IUD  She states that she didn't take her BP medication this morning and denies any HA, visual changes, chest pain or SOB  Review of Systems: as noted in the History of Present Illness.  Patient Active Problem List   Diagnosis Date Noted   Hypertension    Medications: patient unsure of BP medication  Allergies: has No Known Allergies.  Physical Exam:  BP (!) 177/98   Pulse 78   Wt 194 lb (88 kg)   LMP 03/11/2021   BMI 36.66 kg/m  Body mass index is 36.66 kg/m. General appearance: Well nourished, well developed female in no acute distress.  Abdomen: soft, nttp, nd Neuro/Psych:  Normal mood and affect.    Pelvic exam:  EGBUS: normal Vaginal vault: normal Cervix:  IUD strings white, 3-4cm in length and normal Bimanual: negative   Assessment: pt doing well  Plan:  1. Breast cancer screening by mammogram Pt amenable to screening - MM 3D SCREEN BREAST BILATERAL; Future  2. IUD check up Doing well  3. HTN ED precautions given. I asked her to take her BP medication when she gets home and she states that she has a BP cuff; if it's still elevated I asked her to call her PCP for further f/u  RTC: PRN  Durene Romans MD Attending Center for [facility])

## 2021-03-20 DIAGNOSIS — Z419 Encounter for procedure for purposes other than remedying health state, unspecified: Secondary | ICD-10-CM | POA: Diagnosis not present

## 2021-03-22 DIAGNOSIS — E669 Obesity, unspecified: Secondary | ICD-10-CM | POA: Diagnosis not present

## 2021-03-22 DIAGNOSIS — R011 Cardiac murmur, unspecified: Secondary | ICD-10-CM | POA: Diagnosis not present

## 2021-03-22 DIAGNOSIS — I10 Essential (primary) hypertension: Secondary | ICD-10-CM | POA: Diagnosis not present

## 2021-03-22 DIAGNOSIS — Z131 Encounter for screening for diabetes mellitus: Secondary | ICD-10-CM | POA: Diagnosis not present

## 2021-03-22 DIAGNOSIS — Z0000 Encounter for general adult medical examination without abnormal findings: Secondary | ICD-10-CM | POA: Diagnosis not present

## 2021-04-05 DIAGNOSIS — R011 Cardiac murmur, unspecified: Secondary | ICD-10-CM | POA: Diagnosis not present

## 2021-04-05 DIAGNOSIS — E669 Obesity, unspecified: Secondary | ICD-10-CM | POA: Diagnosis not present

## 2021-04-05 DIAGNOSIS — I10 Essential (primary) hypertension: Secondary | ICD-10-CM | POA: Diagnosis not present

## 2021-04-16 ENCOUNTER — Ambulatory Visit: Payer: Medicaid Other

## 2021-04-20 DIAGNOSIS — Z419 Encounter for procedure for purposes other than remedying health state, unspecified: Secondary | ICD-10-CM | POA: Diagnosis not present

## 2021-04-26 ENCOUNTER — Ambulatory Visit
Admission: RE | Admit: 2021-04-26 | Discharge: 2021-04-26 | Disposition: A | Discharge status: Home / Self Care | Payer: Medicaid Other | Source: Ambulatory Visit | Attending: Obstetrics and Gynecology | Admitting: Obstetrics and Gynecology

## 2021-04-26 ENCOUNTER — Other Ambulatory Visit: Payer: Self-pay

## 2021-04-26 DIAGNOSIS — Z1231 Encounter for screening mammogram for malignant neoplasm of breast: Secondary | ICD-10-CM

## 2021-04-30 NOTE — Progress Notes (Signed)
Date:  05/03/2021   ID:  Shelley Young, DOB Apr 02, 1981, MRN [id number]  PCP:  Trey Sailors, PA  Cardiologist:  Rex Kras, DO, [facility]  (established care 05/03/2021)  REASON FOR CONSULT: Heart murmur  REQUESTING PHYSICIAN:  Trey Sailors, [place],  [place] [address]  Chief Complaint  Patient presents with   Heart Murmur   New Patient (Initial Visit)    HPI  Shelley Young is a 40 y.o. African-American female who presents to the office with a chief complaint of " evaluation of a heart murmur." Patient's past medical history and cardiovascular risk factors include: Hypertension, obesity due to excess calories.  She is referred to the office at the request of Trey Sailors, [place] for evaluation of heart murmur.  Patient was recently evaluated by her primary care provider referred to cardiology for evaluation of a cardiac murmur.  From a cardiovascular standpoint she denies any chest pain or shortness of breath at rest or with effort related activities.  No prior cardiovascular comorbidities except hypertension.  No family history of premature coronary disease or sudden cardiac death.   FUNCTIONAL STATUS: No structured exercise program or daily routine.    ALLERGIES: No Known Allergies  MEDICATION LIST PRIOR TO VISIT: Current Meds  Medication Sig   amLODipine (NORVASC) 10 MG tablet Take 10 mg by mouth daily.   lisinopril-hydrochlorothiazide (ZESTORETIC) 10-12.5 MG tablet Take 1 tablet by mouth daily.   PARAGARD INTRAUTERINE COPPER IU by Intrauterine route.     PAST MEDICAL HISTORY: Past Medical History:  Diagnosis Date   Hypertension    Miscarriage    x6    PAST SURGICAL HISTORY: Past Surgical History:  Procedure Laterality Date   CESAREAN SECTION     X 3    FAMILY HISTORY: The patient family history includes Hypertension in her mother.  SOCIAL HISTORY:  The patient  reports that she has never smoked. She has never used  smokeless tobacco. She reports that she does not drink alcohol and does not use drugs.  REVIEW OF SYSTEMS: Review of Systems  Constitutional: Negative for chills and fever.  HENT:  Negative for hoarse voice and nosebleeds.   Eyes:  Negative for discharge, double vision and pain.  Cardiovascular:  Negative for chest pain, claudication, dyspnea on exertion, leg swelling, near-syncope, orthopnea, palpitations, paroxysmal nocturnal dyspnea and syncope.  Respiratory:  Negative for hemoptysis and shortness of breath.   Musculoskeletal:  Negative for muscle cramps and myalgias.  Gastrointestinal:  Negative for abdominal pain, constipation, diarrhea, hematemesis, hematochezia, melena, nausea and vomiting.  Neurological:  Negative for dizziness and light-headedness.   PHYSICAL EXAM: Vitals with BMI 05/03/2021 03/15/2021 03/15/2021  Height 5' 1"  - -  Weight 197 lbs - 194 lbs  BMI 22.33 - -  Systolic 612 244 975  Diastolic 88 98 300  Pulse 83 78 84    CONSTITUTIONAL: Well-developed and well-nourished. No acute distress.  SKIN: Skin is warm and dry. No rash noted. No cyanosis. No pallor. No jaundice HEAD: Normocephalic and atraumatic.  EYES: No scleral icterus MOUTH/THROAT: Moist oral membranes.  NECK: No JVD present. No thyromegaly noted. No carotid bruits  LYMPHATIC: No visible cervical adenopathy.  CHEST Normal respiratory effort. No intercostal retractions  LUNGS: Clear to auscultation bilaterally.  No stridor. No wheezes. No rales.  CARDIOVASCULAR: Regular rate and rhythm, positive S1-S2, no murmurs rubs or gallops appreciated. ABDOMINAL: Obese, soft, nontender, nondistended, positive bowel sounds all 4 quadrants. No apparent ascites.  EXTREMITIES: No peripheral edema, warm to touch, palpable DP and PT pulses HEMATOLOGIC: No significant bruising NEUROLOGIC: Oriented to person, place, and time. Nonfocal. Normal muscle tone.  PSYCHIATRIC: Normal mood and affect. Normal behavior.  Cooperative  CARDIAC DATABASE: EKG: 05/03/2021: NSR, 75 bpm, normal axis, LAE, without underlying injury pattern.   Echocardiogram: No results found for this or any previous visit from the past 1095 days.    Stress Testing: No results found for this or any previous visit from the past 1095 days.   Heart Catheterization: None  LABORATORY DATA: CBC Latest Ref Rng & Units 02/03/2020 11/01/2018 10/26/2018  WBC 3.4 - 10.8 x10E3/uL 6.6 6.1 6.1  Hemoglobin 11.1 - 15.9 g/dL 10.7(L) 11.5(L) 12.2  Hematocrit 34.0 - 46.6 % 36.7 36.5 39.1  Platelets 150 - 450 x10E3/uL 356 253 267    CMP Latest Ref Rng & Units 02/03/2020 10/26/2018 10/30/2017  Glucose 65 - 99 mg/dL 86 89 78  BUN 6 - 24 mg/dL 13 15 15   Creatinine 0.57 - 1.00 mg/dL 0.66 0.79 0.85  Sodium 134 - 144 mmol/L 140 138 140  Potassium 3.5 - 5.2 mmol/L 3.6 3.6 4.5  Chloride 96 - 106 mmol/L 102 100 103  CO2 20 - 29 mmol/L 28 26 25   Calcium 8.7 - 10.2 mg/dL 9.5 9.8 9.7  Total Protein 6.5 - 8.1 g/dL - 8.1 -  Total Bilirubin 0.3 - 1.2 mg/dL - 0.7 -  Alkaline Phos 38 - 126 U/L - 91 -  AST 15 - 41 U/L - 27 -  ALT 0 - 44 U/L - 67(H) -    Lipid Panel     Component Value Date/Time   CHOL 185 02/03/2020 1452   TRIG 141 02/03/2020 1452   HDL 54 02/03/2020 1452   CHOLHDL 3.4 02/03/2020 1452   LDLCALC 106 (H) 02/03/2020 1452   LABVLDL 25 02/03/2020 1452    No components found for: NTPROBNP No results for input(s): PROBNP in the last 8760 hours. No results for input(s): TSH in the last 8760 hours.  BMP No results for input(s): NA, K, CL, CO2, GLUCOSE, BUN, CREATININE, CALCIUM, GFRNONAA, GFRAA in the last 8760 hours.  HEMOGLOBIN A1C Lab Results  Component Value Date   HGBA1C 5.5 02/03/2020   External Labs:  Date Collected: 03/23/2021 , information obtained by [facility]  Potassium: 4.2 Creatinine 0.67 mg/dL. eGFR: 113 mL/min per 1.73 m Hemoglobin: 11.6 g/dL and hematocrit: 39.6 % Lipid profile: Total cholesterol  186 , triglycerides 81 , HDL 62 , LDL 107 AST: 18 , ALT: 17 , alkaline phosphatase: 67  Hemoglobin A1c: 5.1 TSH: 1.49     IMPRESSION:    ICD-10-CM   1. Cardiac murmur  R01.1     2. Benign hypertension  I10 EKG 12-Lead    3. Class 2 severe obesity due to excess calories with serious comorbidity and body mass index (BMI) of 37.0 to 37.9 in adult [facility])  E66.01    Z68.37        RECOMMENDATIONS: Shelley Young is a 40 y.o. African-American female whose past medical history and cardiac risk factors include: Hypertension.   Cardiac murmur No significant cardiac murmur auscultated on physical examination. Initial referral may be due to transitory mitral regurgitation murmur which can be dynamic and dependent on blood pressure. Since then her blood pressure medications have been titrated by her PCP and therefore may have improved. Given her history of hypertension we will continue with echocardiogram to evaluate for structural heart disease  and LVEF.  Benign hypertension Office blood pressure is at goal.  Medication reconciled.  Low salt diet recommended. A diet that is rich in fruits, vegetables, legumes, and low-fat dairy products and low in snacks, sweets, and meats (such as the Dietary Approaches to Stop Hypertension [DASH] diet).  Educated on importance of moderate intensity exercise as tolerated with a goal of 30 minutes a day 5 days a week. Currently managed by primary care provider.  Class 2 severe obesity due to excess calories with serious comorbidity and body mass index (BMI) of 37.0 to 37.9 in adult [facility]) Body mass index is 37.22 kg/m. I reviewed with the patient the importance of diet, regular physical activity/exercise, weight loss.   Patient is educated on increasing physical activity gradually as tolerated.  With the goal of moderate intensity exercise for 30 minutes a day 5 days a week.  Patient is currently not having any ischemic symptoms, no diabetes, no family  history of CAD, we will hold off on stress testing at this time given the fact she is asymptomatic.  However, she is encouraged to increase her physical activity to 30 minutes a day 5 days a week as mentioned above.   As part of this initial consultation reviewed outside records provided by PCP which included office note,  labs these findings have been summarized and noted above for further reference.  Discussed disease management, ordering diagnostic testing, coordination of care and patient education provided as a part of today's encounter.   FINAL MEDICATION LIST END OF ENCOUNTER: No orders of the defined types were placed in this encounter.   There are no discontinued medications.   Current Outpatient Medications:    amLODipine (NORVASC) 10 MG tablet, Take 10 mg by mouth daily., Disp: , Rfl:    lisinopril-hydrochlorothiazide (ZESTORETIC) 10-12.5 MG tablet, Take 1 tablet by mouth daily., Disp: , Rfl:    PARAGARD INTRAUTERINE COPPER IU, by Intrauterine route., Disp: , Rfl:   Orders Placed This Encounter  Procedures   EKG 12-Lead    There are no Patient Instructions on file for this visit.   --Continue cardiac medications as reconciled in final medication list. --Return in about 4 weeks (around 05/31/2021), or if symptoms worsen or fail to improve, for Review echo results . Or sooner if needed. --Continue follow-up with your primary care physician regarding the management of your other chronic comorbid conditions.  Patient's questions and concerns were addressed to her satisfaction. She voices understanding of the instructions provided during this encounter.   This note was created using a voice recognition software as a result there may be grammatical errors inadvertently enclosed that do not reflect the nature of this encounter. Every attempt is made to correct such errors.  Rex Kras, [place], [facility]  Pager: [phone] Office: [phone]

## 2021-05-03 ENCOUNTER — Ambulatory Visit: Payer: Medicaid Other | Admitting: Cardiology

## 2021-05-03 ENCOUNTER — Encounter: Payer: Self-pay | Admitting: Cardiology

## 2021-05-03 ENCOUNTER — Other Ambulatory Visit: Payer: Self-pay

## 2021-05-03 VITALS — BP 129/88 | HR 83 | Temp 97.8°F | Resp 17 | Ht 61.0 in | Wt 197.0 lb

## 2021-05-03 DIAGNOSIS — R011 Cardiac murmur, unspecified: Secondary | ICD-10-CM | POA: Diagnosis not present

## 2021-05-03 DIAGNOSIS — I10 Essential (primary) hypertension: Secondary | ICD-10-CM | POA: Diagnosis not present

## 2021-05-03 DIAGNOSIS — Z6837 Body mass index (BMI) 37.0-37.9, adult: Secondary | ICD-10-CM

## 2021-05-12 ENCOUNTER — Other Ambulatory Visit: Payer: Self-pay

## 2021-05-12 DIAGNOSIS — I10 Essential (primary) hypertension: Secondary | ICD-10-CM

## 2021-05-12 DIAGNOSIS — R011 Cardiac murmur, unspecified: Secondary | ICD-10-CM

## 2021-05-20 DIAGNOSIS — Z419 Encounter for procedure for purposes other than remedying health state, unspecified: Secondary | ICD-10-CM | POA: Diagnosis not present

## 2021-06-20 DIAGNOSIS — Z419 Encounter for procedure for purposes other than remedying health state, unspecified: Secondary | ICD-10-CM | POA: Diagnosis not present

## 2021-07-05 DIAGNOSIS — E669 Obesity, unspecified: Secondary | ICD-10-CM | POA: Diagnosis not present

## 2021-07-05 DIAGNOSIS — E7800 Pure hypercholesterolemia, unspecified: Secondary | ICD-10-CM | POA: Diagnosis not present

## 2021-07-05 DIAGNOSIS — I10 Essential (primary) hypertension: Secondary | ICD-10-CM | POA: Diagnosis not present

## 2021-07-21 DIAGNOSIS — Z419 Encounter for procedure for purposes other than remedying health state, unspecified: Secondary | ICD-10-CM | POA: Diagnosis not present

## 2021-08-18 DIAGNOSIS — Z419 Encounter for procedure for purposes other than remedying health state, unspecified: Secondary | ICD-10-CM | POA: Diagnosis not present

## 2021-09-18 DIAGNOSIS — Z419 Encounter for procedure for purposes other than remedying health state, unspecified: Secondary | ICD-10-CM | POA: Diagnosis not present

## 2021-10-11 DIAGNOSIS — E669 Obesity, unspecified: Secondary | ICD-10-CM | POA: Diagnosis not present

## 2021-10-11 DIAGNOSIS — R011 Cardiac murmur, unspecified: Secondary | ICD-10-CM | POA: Diagnosis not present

## 2021-10-11 DIAGNOSIS — E7800 Pure hypercholesterolemia, unspecified: Secondary | ICD-10-CM | POA: Diagnosis not present

## 2021-10-11 DIAGNOSIS — I10 Essential (primary) hypertension: Secondary | ICD-10-CM | POA: Diagnosis not present

## 2021-10-18 DIAGNOSIS — Z419 Encounter for procedure for purposes other than remedying health state, unspecified: Secondary | ICD-10-CM | POA: Diagnosis not present

## 2021-11-18 DIAGNOSIS — Z419 Encounter for procedure for purposes other than remedying health state, unspecified: Secondary | ICD-10-CM | POA: Diagnosis not present

## 2021-12-18 DIAGNOSIS — Z419 Encounter for procedure for purposes other than remedying health state, unspecified: Secondary | ICD-10-CM | POA: Diagnosis not present

## 2022-01-18 DIAGNOSIS — Z419 Encounter for procedure for purposes other than remedying health state, unspecified: Secondary | ICD-10-CM | POA: Diagnosis not present

## 2022-02-18 DIAGNOSIS — Z419 Encounter for procedure for purposes other than remedying health state, unspecified: Secondary | ICD-10-CM | POA: Diagnosis not present

## 2022-03-14 ENCOUNTER — Other Ambulatory Visit: Payer: Self-pay | Admitting: Obstetrics and Gynecology

## 2022-03-14 DIAGNOSIS — I10 Essential (primary) hypertension: Secondary | ICD-10-CM | POA: Diagnosis not present

## 2022-03-14 DIAGNOSIS — Z131 Encounter for screening for diabetes mellitus: Secondary | ICD-10-CM | POA: Diagnosis not present

## 2022-03-14 DIAGNOSIS — R011 Cardiac murmur, unspecified: Secondary | ICD-10-CM | POA: Diagnosis not present

## 2022-03-14 DIAGNOSIS — Z1231 Encounter for screening mammogram for malignant neoplasm of breast: Secondary | ICD-10-CM

## 2022-03-14 DIAGNOSIS — E7800 Pure hypercholesterolemia, unspecified: Secondary | ICD-10-CM | POA: Diagnosis not present

## 2022-03-14 DIAGNOSIS — Z0000 Encounter for general adult medical examination without abnormal findings: Secondary | ICD-10-CM | POA: Diagnosis not present

## 2022-03-14 DIAGNOSIS — E669 Obesity, unspecified: Secondary | ICD-10-CM | POA: Diagnosis not present

## 2022-03-20 DIAGNOSIS — Z419 Encounter for procedure for purposes other than remedying health state, unspecified: Secondary | ICD-10-CM | POA: Diagnosis not present

## 2022-04-20 DIAGNOSIS — Z419 Encounter for procedure for purposes other than remedying health state, unspecified: Secondary | ICD-10-CM | POA: Diagnosis not present

## 2022-04-21 ENCOUNTER — Telehealth: Payer: Self-pay | Admitting: Cardiology

## 2022-04-21 NOTE — Telephone Encounter (Signed)
Spoke to pt regarding testing that was never completed. Pt stated that her heart was fine and she no longer needed to f/u w/cardiology. Pt asked me to cancel testing orders.

## 2022-05-02 ENCOUNTER — Ambulatory Visit
Admission: RE | Admit: 2022-05-02 | Discharge: 2022-05-02 | Disposition: A | Discharge status: Home / Self Care | Payer: Medicaid Other | Source: Ambulatory Visit | Attending: Obstetrics and Gynecology | Admitting: Obstetrics and Gynecology

## 2022-05-02 DIAGNOSIS — Z1231 Encounter for screening mammogram for malignant neoplasm of breast: Secondary | ICD-10-CM

## 2022-05-20 DIAGNOSIS — Z419 Encounter for procedure for purposes other than remedying health state, unspecified: Secondary | ICD-10-CM | POA: Diagnosis not present

## 2022-06-06 ENCOUNTER — Ambulatory Visit: Payer: Medicaid Other | Admitting: Obstetrics and Gynecology

## 2022-06-20 DIAGNOSIS — Z419 Encounter for procedure for purposes other than remedying health state, unspecified: Secondary | ICD-10-CM | POA: Diagnosis not present

## 2022-06-21 ENCOUNTER — Ambulatory Visit: Payer: Medicaid Other | Admitting: Obstetrics and Gynecology

## 2022-06-27 DIAGNOSIS — E7800 Pure hypercholesterolemia, unspecified: Secondary | ICD-10-CM | POA: Diagnosis not present

## 2022-06-27 DIAGNOSIS — I10 Essential (primary) hypertension: Secondary | ICD-10-CM | POA: Diagnosis not present

## 2022-06-27 DIAGNOSIS — E669 Obesity, unspecified: Secondary | ICD-10-CM | POA: Diagnosis not present

## 2022-06-27 DIAGNOSIS — D649 Anemia, unspecified: Secondary | ICD-10-CM | POA: Diagnosis not present

## 2022-06-27 DIAGNOSIS — R011 Cardiac murmur, unspecified: Secondary | ICD-10-CM | POA: Diagnosis not present

## 2022-07-04 ENCOUNTER — Encounter: Payer: Self-pay | Admitting: Obstetrics & Gynecology

## 2022-07-04 ENCOUNTER — Ambulatory Visit (INDEPENDENT_AMBULATORY_CARE_PROVIDER_SITE_OTHER): Payer: Medicaid Other | Admitting: Obstetrics & Gynecology

## 2022-07-04 VITALS — BP 120/74 | HR 67

## 2022-07-04 DIAGNOSIS — Z0000 Encounter for general adult medical examination without abnormal findings: Secondary | ICD-10-CM

## 2022-07-04 DIAGNOSIS — Z1330 Encounter for screening examination for mental health and behavioral disorders, unspecified: Secondary | ICD-10-CM

## 2022-07-04 DIAGNOSIS — Z975 Presence of (intrauterine) contraceptive device: Secondary | ICD-10-CM | POA: Diagnosis not present

## 2022-07-04 DIAGNOSIS — D649 Anemia, unspecified: Secondary | ICD-10-CM | POA: Insufficient documentation

## 2022-07-04 NOTE — Progress Notes (Signed)
Patient ID: Shelley Young, female   DOB: 1981/04/29, 42 y.o.   MRN: [id number]  Chief Complaint  Patient presents with   Gynecologic Exam    HPI Shelley Young is a 42 y.o. female.  [id number] No LMP recorded. (Menstrual status: IUD). She has regular menses with flow for about five days with 3 days of heavier bleeding using 4 pads a day. No problem with cramps. Her PCP tells her to take iron for mild anemia. She declines breast exam and blood test today. Pap is normal and up to date. HPI  Past Medical History:  Diagnosis Date   Hypertension    Miscarriage    x6    Past Surgical History:  Procedure Laterality Date   CESAREAN SECTION     X 3    Family History  Problem Relation Age of Onset   Hypertension Mother     Social History Social History   Tobacco Use   Smoking status: Never   Smokeless tobacco: Never  Vaping Use   Vaping Use: Never used  Substance Use Topics   Alcohol use: No   Drug use: No    No Known Allergies  Current Outpatient Medications  Medication Sig Dispense Refill   amLODipine (NORVASC) 10 MG tablet Take 10 mg by mouth daily.     lisinopril-hydrochlorothiazide (ZESTORETIC) 10-12.5 MG tablet Take 1 tablet by mouth daily.     PARAGARD INTRAUTERINE COPPER IU by Intrauterine route.     No current facility-administered medications for this visit.    Review of Systems Review of Systems  Constitutional: Negative.   Respiratory: Negative.    Cardiovascular: Negative.   Gastrointestinal: Negative.   Genitourinary:  Positive for menstrual problem. Negative for pelvic pain, vaginal bleeding and vaginal discharge. Hematuria: heavier flow for 3 days each month.   Blood pressure 120/74, pulse 67, unknown if currently breastfeeding.  Physical Exam Physical Exam Constitutional:      Appearance: She is obese. She is ill-appearing.  HENT:     Head: Normocephalic and atraumatic.  Eyes:     Pupils: Pupils are equal, round, and reactive to light.   Cardiovascular:     Rate and Rhythm: Normal rate.  Pulmonary:     Effort: Pulmonary effort is normal.  Musculoskeletal:     Cervical back: Normal range of motion.  Neurological:     Mental Status: She is alert.  Psychiatric:        Mood and Affect: Mood normal.        Behavior: Behavior normal.     Data Reviewed CBC    Component Value Date/Time   WBC 6.6 02/03/2020 1452   WBC 6.1 11/01/2018 0831   RBC 4.68 02/03/2020 1452   RBC 4.61 11/01/2018 0831   HGB 10.7 (L) 02/03/2020 1452   HCT 36.7 02/03/2020 1452   PLT 356 02/03/2020 1452   MCV 78 (L) 02/03/2020 1452   MCH 22.9 (L) 02/03/2020 1452   MCH 24.9 (L) 11/01/2018 0831   MCHC 29.2 (L) 02/03/2020 1452   MCHC 31.5 11/01/2018 0831   RDW 14.0 02/03/2020 1452   LYMPHSABS 2.7 11/03/2014 1650   MONOABS 0.8 11/03/2014 1650   EOSABS 0.1 11/03/2014 1650   BASOSABS 0.0 11/03/2014 1650   Component 2 yr ago  High risk HPV Negative  Adequacy Satisfactory for evaluation; transformation zone component PRESENT.  Diagnosis - Negative for Intraepithelial Lesions or Malignancy (NILM)  Diagnosis - Benign reactive/reparative changes  Comment Normal Reference Range HPV - Negative  Resulting Agency [facility]    Assessment Well woman exam without gynecological exam  IUD (intrauterine device) in place Doing well with Paragard and she is satisfied  Plan RTC 1 year for pap and continue yearly mammogram    Emeterio Reeve 07/04/2022, 8:55 AM

## 2022-07-21 DIAGNOSIS — Z419 Encounter for procedure for purposes other than remedying health state, unspecified: Secondary | ICD-10-CM | POA: Diagnosis not present

## 2022-08-19 DIAGNOSIS — Z419 Encounter for procedure for purposes other than remedying health state, unspecified: Secondary | ICD-10-CM | POA: Diagnosis not present

## 2022-09-19 DIAGNOSIS — Z419 Encounter for procedure for purposes other than remedying health state, unspecified: Secondary | ICD-10-CM | POA: Diagnosis not present

## 2022-09-26 DIAGNOSIS — I10 Essential (primary) hypertension: Secondary | ICD-10-CM | POA: Diagnosis not present

## 2022-09-26 DIAGNOSIS — E669 Obesity, unspecified: Secondary | ICD-10-CM | POA: Diagnosis not present

## 2022-09-26 DIAGNOSIS — R011 Cardiac murmur, unspecified: Secondary | ICD-10-CM | POA: Diagnosis not present

## 2022-09-26 DIAGNOSIS — D508 Other iron deficiency anemias: Secondary | ICD-10-CM | POA: Diagnosis not present

## 2022-09-26 DIAGNOSIS — E7800 Pure hypercholesterolemia, unspecified: Secondary | ICD-10-CM | POA: Diagnosis not present

## 2022-10-19 DIAGNOSIS — Z419 Encounter for procedure for purposes other than remedying health state, unspecified: Secondary | ICD-10-CM | POA: Diagnosis not present

## 2022-11-19 DIAGNOSIS — Z419 Encounter for procedure for purposes other than remedying health state, unspecified: Secondary | ICD-10-CM | POA: Diagnosis not present

## 2022-12-19 DIAGNOSIS — D508 Other iron deficiency anemias: Secondary | ICD-10-CM | POA: Diagnosis not present

## 2022-12-19 DIAGNOSIS — R011 Cardiac murmur, unspecified: Secondary | ICD-10-CM | POA: Diagnosis not present

## 2022-12-19 DIAGNOSIS — E669 Obesity, unspecified: Secondary | ICD-10-CM | POA: Diagnosis not present

## 2022-12-19 DIAGNOSIS — I10 Essential (primary) hypertension: Secondary | ICD-10-CM | POA: Diagnosis not present

## 2022-12-19 DIAGNOSIS — Z419 Encounter for procedure for purposes other than remedying health state, unspecified: Secondary | ICD-10-CM | POA: Diagnosis not present

## 2022-12-19 DIAGNOSIS — E7800 Pure hypercholesterolemia, unspecified: Secondary | ICD-10-CM | POA: Diagnosis not present

## 2023-01-19 DIAGNOSIS — Z419 Encounter for procedure for purposes other than remedying health state, unspecified: Secondary | ICD-10-CM | POA: Diagnosis not present

## 2023-03-31 ENCOUNTER — Other Ambulatory Visit: Payer: Self-pay | Admitting: Obstetrics and Gynecology

## 2023-03-31 DIAGNOSIS — Z1231 Encounter for screening mammogram for malignant neoplasm of breast: Secondary | ICD-10-CM

## 2023-05-15 ENCOUNTER — Ambulatory Visit
Admission: RE | Admit: 2023-05-15 | Discharge: 2023-05-15 | Disposition: A | Discharge status: Home / Self Care | Payer: Medicaid Other | Source: Ambulatory Visit | Attending: Obstetrics and Gynecology | Admitting: Obstetrics and Gynecology

## 2023-05-15 DIAGNOSIS — Z1231 Encounter for screening mammogram for malignant neoplasm of breast: Secondary | ICD-10-CM

## 2023-08-07 ENCOUNTER — Encounter: Payer: Self-pay | Admitting: Obstetrics & Gynecology

## 2023-08-07 ENCOUNTER — Ambulatory Visit: Payer: Medicaid Other | Admitting: Obstetrics & Gynecology

## 2023-08-07 ENCOUNTER — Other Ambulatory Visit (HOSPITAL_COMMUNITY)
Admission: RE | Admit: 2023-08-07 | Discharge: 2023-08-07 | Disposition: A | Discharge status: Home / Self Care | Payer: Medicaid Other | Source: Ambulatory Visit | Attending: Obstetrics & Gynecology | Admitting: Obstetrics & Gynecology

## 2023-08-07 VITALS — BP 138/73 | HR 64 | Wt 203.0 lb

## 2023-08-07 DIAGNOSIS — Z1331 Encounter for screening for depression: Secondary | ICD-10-CM | POA: Diagnosis not present

## 2023-08-07 DIAGNOSIS — Z01419 Encounter for gynecological examination (general) (routine) without abnormal findings: Secondary | ICD-10-CM

## 2023-08-07 DIAGNOSIS — Z975 Presence of (intrauterine) contraceptive device: Secondary | ICD-10-CM | POA: Diagnosis not present

## 2023-08-07 NOTE — Progress Notes (Signed)
 GYNECOLOGY CLINIC ANNUAL PREVENTATIVE CARE ENCOUNTER NOTE  Subjective:   Shelley Young is a 43 y.o. [id number] female here for a routine annual gynecologic exam.  Current complaints: menses are heavy for a few days but is satisfied with her IUP.   Denies abnormal vaginal bleeding, discharge, pelvic pain, problems with intercourse or other gynecologic concerns.    Gynecologic History Patient's last menstrual period was 07/31/2023 (exact date). Contraception: IUD Last Pap: 2021. Results were: normal Last mammogram: 2024. Results were: normal  Obstetric History OB History  Gravida Para Term Preterm AB Living  12 3 1 2 9 3   SAB IAB Ectopic Multiple Live Births  9 0 0 0 3    # Outcome Date GA Lbr Len/2nd Weight Sex Type Anes PTL Lv  12 SAB 11/03/18 [redacted]w[redacted]d         11 SAB 09/2017 [redacted]w[redacted]d         10 SAB 11/03/14 [redacted]w[redacted]d         9 Preterm 2011    M CS-LTranv  Y LIV  8 SAB 10/31/07 [redacted]w[redacted]d         7 Preterm 08/18/05 [redacted]w[redacted]d   M CS-LTranv  Y LIV     Complications: Breech birth, Severe preeclampsia, third trimester  6 Term 12/13/02 [redacted]w[redacted]d   M CS-LTranv  Y LIV     Complications: Breech birth  5 SAB 06/26/01          4 SAB 2003          3 SAB 10/19/00          2 SAB 07/10/99          1 SAB 01/01/99            Past Medical History:  Diagnosis Date   Hypertension    Miscarriage    x6    Past Surgical History:  Procedure Laterality Date   CESAREAN SECTION     X 3    Current Outpatient Medications on File Prior to Visit  Medication Sig Dispense Refill   amLODipine (NORVASC) 10 MG tablet Take 10 mg by mouth daily.     atenolol-chlorthalidone (TENORETIC) 50-25 MG tablet Take 1 tablet by mouth daily.     PARAGARD INTRAUTERINE COPPER IU by Intrauterine route.     lisinopril-hydrochlorothiazide (ZESTORETIC) 10-12.5 MG tablet Take 1 tablet by mouth daily. (Patient not taking: Reported on 08/07/2023)     No current facility-administered medications on file prior to visit.    No Known  Allergies  Social History   Socioeconomic History   Marital status: Married    Spouse name: Not on file   Number of children: 3   Years of education: Not on file   Highest education level: Not on file  Occupational History   Not on file  Tobacco Use   Smoking status: Never   Smokeless tobacco: Never  Vaping Use   Vaping status: Never Used  Substance and Sexual Activity   Alcohol use: No   Drug use: No   Sexual activity: Yes    Birth control/protection: None    Comment: last sex one month ago  Other Topics Concern   Not on file  Social History Narrative   Not on file   Social Drivers of Health   Financial Resource Strain: Not on file  Food Insecurity: Food Insecurity Present (08/07/2023)   Hunger Vital Sign    Worried About Running Out of Food in the Last Year: Sometimes true  Ran Out of Food in the Last Year: Sometimes true  Transportation Needs: No Transportation Needs (08/07/2023)   PRAPARE - Administrator, Civil Service (Medical): No    Lack of Transportation (Non-Medical): No  Physical Activity: Not on file  Stress: Not on file  Social Connections: Not on file  Intimate Partner Violence: Not on file    Family History  Problem Relation Age of Onset   Hypertension Mother    BRCA 1/2 Neg Hx    Breast cancer Neg Hx     The following portions of the patient's history were reviewed and updated as appropriate: allergies, current medications, past family history, past medical history, past social history, past surgical history and problem list.  Review of Systems Pertinent items are noted in HPI.   Objective:  BP (!) 141/70   Pulse 70   Wt 203 lb (92.1 kg)   LMP 07/31/2023 (Exact Date)   BMI 38.36 kg/m  CONSTITUTIONAL: Well-developed, well-nourished female in no acute distress.  HENT:  Normocephalic, atraumatic, External right and left ear normal. Oropharynx is clear and moist EYES: Conjunctivae and EOM are normal. Pupils are equal, round, and  reactive to light. No scleral icterus.  NECK: Normal range of motion, supple, no masses.  Normal thyroid.  SKIN: Skin is warm and dry. Facial acne. Not diaphoretic. No erythema. No pallor. NEUROLGIC: Alert and oriented to person, place, and time. Normal reflexes, muscle tone coordination. No cranial nerve deficit noted. PSYCHIATRIC: Normal mood and affect. Normal behavior. Normal judgment and thought content. CARDIOVASCULAR: Normal heart rate noted, regular rhythm RESPIRATORY: Clear to auscultation bilaterally. Effort  normal, no problems with respiration noted. BREASTS: Symmetric in size. No masses, skin changes, nipple drainage, or lymphadenopathy. ABDOMEN: Soft, normal bowel sounds, no distention noted.  No tenderness, rebound or guarding.  PELVIC: Normal appearing external genitalia; normal appearing vaginal mucosa and cervix.  No abnormal discharge noted.  Pap smear obtained.  Normal uterine size, no other palpable masses, no uterine or adnexal tenderness. IUD string 2 cm MUSCULOSKELETAL: Normal range of motion. No tenderness.  No cyanosis, clubbing, or edema.  Assessment:  Annual gynecologic examination with pap smear   Plan:  Will follow up results of pap smear and manage accordingly. Routine preventative health maintenance measures emphasized. Please refer to After Visit Summary for other counseling recommendations.    Scheryl Darter, MD Attending Obstetrician & Gynecologist Center for [employer], [facility]

## 2023-08-08 LAB — HEPATITIS B SURFACE ANTIGEN: Hepatitis B Surface Ag: NEGATIVE

## 2023-08-08 LAB — RPR: RPR Ser Ql: NONREACTIVE

## 2023-08-08 LAB — HIV ANTIBODY (ROUTINE TESTING W REFLEX): HIV Screen 4th Generation wRfx: NONREACTIVE

## 2023-08-08 LAB — HEPATITIS C ANTIBODY: Hep C Virus Ab: NONREACTIVE

## 2023-08-10 LAB — CYTOLOGY - PAP
Chlamydia: NEGATIVE
Comment: NEGATIVE
Comment: NEGATIVE
Comment: NEGATIVE
Comment: NORMAL
Diagnosis: NEGATIVE
High risk HPV: NEGATIVE
Neisseria Gonorrhea: NEGATIVE
Trichomonas: NEGATIVE

## 2024-04-12 ENCOUNTER — Other Ambulatory Visit: Payer: Self-pay | Admitting: Physician Assistant

## 2024-04-12 DIAGNOSIS — Z1231 Encounter for screening mammogram for malignant neoplasm of breast: Secondary | ICD-10-CM

## 2024-05-20 ENCOUNTER — Ambulatory Visit
Admission: RE | Admit: 2024-05-20 | Discharge: 2024-05-20 | Disposition: A | Source: Ambulatory Visit | Attending: Physician Assistant | Admitting: Physician Assistant

## 2024-05-20 DIAGNOSIS — Z1231 Encounter for screening mammogram for malignant neoplasm of breast: Secondary | ICD-10-CM

## 2024-06-10 ENCOUNTER — Ambulatory Visit

## 2024-06-10 VITALS — Ht 61.0 in | Wt 206.8 lb

## 2024-06-10 DIAGNOSIS — Z1211 Encounter for screening for malignant neoplasm of colon: Secondary | ICD-10-CM

## 2024-06-10 MED ORDER — PEG 3350-KCL-NA BICARB-NACL 420 G PO SOLR: 4000.0000 mL | Freq: Once | ORAL | 0 refills | Status: AC

## 2024-06-10 NOTE — Progress Notes (Signed)
 PCP MD at time of PV: Rosina Amy, PA  __________________________________________________________________________________________________________________________________________  No egg allergy known to patient  No soy allergy known to patient No issues known to pt with past sedation with any surgeries or procedures Patient denies ever being told they had issues or difficulty with intubation  No FH of Malignant Hyperthermia Pt is not on diet pills Pt is not on  home 02  Pt is not on blood thinners  No A fib or A flutter Have any cardiac testing pending--no LOA: independent  No Chew or Snuff tobacco __________________________________________________________________________________________________________________________________________  Constipation: intermittent  Prep: Golytely  __________________________________________________________________________________________________________________________________________  PV completed with patient. Prep instructions reviewed and provided during apt. Rx sent to preferred pharmacy.  __________________________________________________________________________________________________________________________________________  Patient's chart reviewed by Norleen Schillings CNRA prior to previsit and patient appropriate for the [facility].  Previsit completed and red dot placed by patient's name on their procedure day (on provider's schedule).

## 2024-06-23 NOTE — Progress Notes (Unsigned)
 [facility] History and Physical   Primary Care Physician:  Rosalea Rosina SAILOR, PA   Reason for Procedure:    Encounter Diagnosis  Name Primary?   Special screening for malignant neoplasms, colon Yes     Plan:    Colonoscopy   The patient was provided an opportunity to ask questions and all were answered. The patient agreed with the plan.   HPI: Shelley Young is a 44 y.o. female presenting for a screening colonoscopy.   Past Medical History:  Diagnosis Date   Hypertension    Miscarriage    x6    Past Surgical History:  Procedure Laterality Date   CESAREAN SECTION     X 3     Current Outpatient Medications  Medication Sig Dispense Refill   atenolol-chlorthalidone (TENORETIC) 50-25 MG tablet Take 1 tablet by mouth daily.     Multiple Vitamins-Minerals (WOMENS MULTIVITAMIN PO) Take 1 tablet by mouth daily.     amLODipine  (NORVASC ) 10 MG tablet Take 10 mg by mouth daily. (Patient not taking: No sig reported)     lisinopril-hydrochlorothiazide (ZESTORETIC) 10-12.5 MG tablet Take 1 tablet by mouth daily. (Patient not taking: Reported on 08/07/2023)     PARAGARD  INTRAUTERINE COPPER  IU by Intrauterine route.     Current Facility-Administered Medications  Medication Dose Route Frequency Provider Last Rate Last Admin   0.9 %  sodium chloride  infusion  500 mL Intravenous Once Avram Lupita BRAVO, MD        Allergies as of 06/24/2024   (No Known Allergies)    Family History  Problem Relation Age of Onset   Hypertension Mother    BRCA 1/2 Neg Hx    Breast cancer Neg Hx    Colon cancer Neg Hx    Rectal cancer Neg Hx    Stomach cancer Neg Hx    Esophageal cancer Neg Hx     Social History   Socioeconomic History   Marital status: Married    Spouse name: Not on file   Number of children: 3   Years of education: Not on file   Highest education level: Not on file  Occupational History   Not on file  Tobacco Use   Smoking status: Never   Smokeless  tobacco: Never  Vaping Use   Vaping status: Never Used  Substance and Sexual Activity   Alcohol use: No   Drug use: No   Sexual activity: Yes    Birth control/protection: None    Comment: last sex one month ago  Other Topics Concern   Not on file  Social History Narrative   [employer] - in [place]  since about [address]   Hair Stylist   Social Drivers of Health   Tobacco Use: Low Risk (06/24/2024)   Patient History    Smoking Tobacco Use: Never    Smokeless Tobacco Use: Never    Passive Exposure: Not on file  Financial Resource Strain: Not on file  Food Insecurity: Food Insecurity Present (08/07/2023)   Hunger Vital Sign    Worried About Running Out of Food in the Last Year: Sometimes true    Ran Out of Food in the Last Year: Sometimes true  Transportation Needs: No Transportation Needs (08/07/2023)   PRAPARE - Administrator, Civil Service (Medical): No    Lack of Transportation (Non-Medical): No  Physical Activity: Not on file  Stress: Not on file  Social Connections: Not on file  Intimate Partner Violence: Not on file  Depression (PHQ2-9): Low Risk (08/07/2023)   Depression (PHQ2-9)    PHQ-2 Score: 0  Alcohol Screen: Not on file  Housing: Not on file  Utilities: Not on file  Health Literacy: Not on file    Review of Systems:  All other review of systems negative except as mentioned in the HPI.  Physical Exam: Vital signs BP (!) 151/88   Pulse 78   Temp 98.6 F (37 C) (Temporal)   Resp 17   Ht 5' 1 (1.549 m)   Wt 206 lb (93.4 kg)   SpO2 100%   BMI 38.92 kg/m   General:   Alert,  Well-developed, well-nourished, pleasant and cooperative in NAD Lungs:  Clear throughout to auscultation.   Heart:  Regular rate and rhythm; no murmurs, clicks, rubs,  or gallops. Abdomen:  Soft, nontender and nondistended. Normal bowel sounds.   Neuro/Psych:  Alert and cooperative. Normal mood and affect. A and O x 3   @Tasheem Elms  CHARLENA Commander, MD, [facility]  Gastroenterology [phone] (pager) 06/24/2024 9:16 AM@

## 2024-06-24 ENCOUNTER — Ambulatory Visit: Admitting: Internal Medicine

## 2024-06-24 ENCOUNTER — Encounter: Payer: Self-pay | Admitting: Internal Medicine

## 2024-06-24 VITALS — BP 126/80 | HR 68 | Temp 98.6°F | Resp 16 | Ht 61.0 in | Wt 206.0 lb

## 2024-06-24 DIAGNOSIS — K644 Residual hemorrhoidal skin tags: Secondary | ICD-10-CM

## 2024-06-24 DIAGNOSIS — Z1211 Encounter for screening for malignant neoplasm of colon: Secondary | ICD-10-CM | POA: Diagnosis not present

## 2024-06-24 DIAGNOSIS — K621 Rectal polyp: Secondary | ICD-10-CM | POA: Diagnosis not present

## 2024-06-24 DIAGNOSIS — D128 Benign neoplasm of rectum: Secondary | ICD-10-CM

## 2024-06-24 MED ORDER — SODIUM CHLORIDE 0.9 % IV SOLN: 500.0000 mL | Freq: Once | INTRAVENOUS | Status: DC

## 2024-06-24 NOTE — Progress Notes (Signed)
 Pt's states no medical or surgical changes since previsit or office visit.

## 2024-06-24 NOTE — Op Note (Signed)
 [facility] Patient Name: Shelley Young Procedure Date: 06/24/2024 9:02 AM MRN: [id number] Endoscopist: Lupita FORBES Commander , MD, [id number] Age: 44 Referring MD:  Date of Birth: 1980/10/21 Gender: Female Account #: [id number] Procedure:                Colonoscopy Indications:              Screening for colorectal malignant neoplasm, This                            is the patient's first colonoscopy Medicines:                Monitored Anesthesia Care Procedure:                Pre-Anesthesia Assessment:                           - Prior to the procedure, a History and Physical                            was performed, and patient medications and                            allergies were reviewed. The patient's tolerance of                            previous anesthesia was also reviewed. The risks                            and benefits of the procedure and the sedation                            options and risks were discussed with the patient.                            All questions were answered, and informed consent                            was obtained. Prior Anticoagulants: The patient has                            taken no anticoagulant or antiplatelet agents. ASA                            Grade Assessment: II - A patient with mild systemic                            disease. After reviewing the risks and benefits,                            the patient was deemed in satisfactory condition to                            undergo the procedure.  After obtaining informed consent, the colonoscope                            was passed under direct vision. Throughout the                            procedure, the patient's blood pressure, pulse, and                            oxygen saturations were monitored continuously. The                            CF [id number] #[id number] was introduced through the anus                            and advanced to the  the cecum, identified by                            appendiceal orifice and ileocecal valve. The                            colonoscopy was performed without difficulty. The                            patient tolerated the procedure well. The quality                            of the bowel preparation was good. The ileocecal                            valve, appendiceal orifice, and rectum were                            photographed. The bowel preparation used was                            GoLYTELY via split dose instruction. Scope In: 9:17:35 AM Scope Out: 9:28:35 AM Scope Withdrawal Time: 0 hours 8 minutes 15 seconds  Total Procedure Duration: 0 hours 11 minutes 0 seconds  Findings:                 Hemorrhoids were found on perianal exam.                           A 3 mm polyp was found in the rectum. The polyp was                            sessile. The polyp was removed with a cold snare.                            Resection and retrieval were complete. Verification                            of patient identification for  the specimen was                            done. Estimated blood loss was minimal.                           The exam was otherwise without abnormality on                            direct and retroflexion views. Complications:            No immediate complications. Estimated Blood Loss:     Estimated blood loss was minimal. Impression:               - Hemorrhoids found on perianal exam.                           - One 3 mm polyp in the rectum, removed with a cold                            snare. Resected and retrieved.                           - The examination was otherwise normal on direct                            and retroflexion views. Recommendation:           - Patient has a contact number available for                            emergencies. The signs and symptoms of potential                            delayed complications were discussed with the                             patient. Return to normal activities tomorrow.                            Written discharge instructions were provided to the                            patient.                           - Resume previous diet.                           - Continue present medications.                           - Repeat colonoscopy is recommended. The                            colonoscopy date will be determined after pathology  results from today's exam become available for                            review. Lupita FORBES Commander, MD 06/24/2024 9:42:01 AM This report has been signed electronically.

## 2024-06-24 NOTE — Progress Notes (Signed)
 Called to room to assist during endoscopic procedure.  Patient ID and intended procedure confirmed with present staff. Received instructions for my participation in the procedure from the performing physician.

## 2024-06-24 NOTE — Progress Notes (Signed)
 Report given to PACU, vss

## 2024-06-24 NOTE — Patient Instructions (Addendum)
 I found and removed 1 tiny polyp.  It will be analyzed to understand what it is and what that means as far as repeating a colonoscopy.  I will let you know. You have external hemorrhoids.  Please read the handout.  I appreciate the opportunity care for you. Lupita CHARLENA Commander, MD, [facility]  Resume previous diet.  Continue present medications.  Repeat colonoscopy is recommended. The colonoscopy date will be determined after pathology results.   Handouts provided on polyps and hemorrhoids.   YOU HAD AN ENDOSCOPIC PROCEDURE TODAY AT THE [facility]:   Refer to the procedure report that was given to you for any specific questions about what was found during the examination.  If the procedure report does not answer your questions, please call your gastroenterologist to clarify.  If you requested that your care partner not be given the details of your procedure findings, then the procedure report has been included in a sealed envelope for you to review at your convenience later.  YOU SHOULD EXPECT: Some feelings of bloating in the abdomen. Passage of more gas than usual.  Walking can help get rid of the air that was put into your GI tract during the procedure and reduce the bloating. If you had a lower endoscopy (such as a colonoscopy or flexible sigmoidoscopy) you may notice spotting of blood in your stool or on the toilet paper. If you underwent a bowel prep for your procedure, you may not have a normal bowel movement for a few days.  Please Note:  You might notice some irritation and congestion in your nose or some drainage.  This is from the oxygen used during your procedure.  There is no need for concern and it should clear up in a day or so.  SYMPTOMS TO REPORT IMMEDIATELY:  Following lower endoscopy (colonoscopy or flexible sigmoidoscopy):  Excessive amounts of blood in the stool  Significant tenderness or worsening of abdominal pains  Swelling of the abdomen that is new,  acute  Fever of 100F or higher  For urgent or emergent issues, a gastroenterologist can be reached at any hour by calling [phone]. Do not use MyChart messaging for urgent concerns.    DIET:  We do recommend a small meal at first, but then you may proceed to your regular diet.  Drink plenty of fluids but you should avoid alcoholic beverages for 24 hours.  ACTIVITY:  You should plan to take it easy for the rest of today and you should NOT DRIVE or use heavy machinery until tomorrow (because of the sedation medicines used during the test).    FOLLOW UP: Our staff will call the number listed on your records the next business day following your procedure.  We will call around 7:15- 8:00 am to check on you and address any questions or concerns that you may have regarding the information given to you following your procedure. If we do not reach you, we will leave a message.     If any biopsies were taken you will be contacted by phone or by letter within the next 1-3 weeks.  Please call us  at [phone] if you have not heard about the biopsies in 3 weeks.    SIGNATURES/CONFIDENTIALITY: You and/or your care partner have signed paperwork which will be entered into your electronic medical record.  These signatures attest to the fact that that the information above on your After Visit Summary has been reviewed and is understood.  Full responsibility of  the confidentiality of this discharge information lies with you and/or your care-partner.

## 2024-06-25 ENCOUNTER — Telehealth: Payer: Self-pay

## 2024-06-25 NOTE — Telephone Encounter (Signed)
 Follow up call to pt, lm for pt to call if having any difficulty with normal activities or eating and drinking.  Also to call if any other questions or concerns.

## 2024-06-26 LAB — SURGICAL PATHOLOGY

## 2024-07-02 ENCOUNTER — Ambulatory Visit: Payer: Self-pay | Admitting: Internal Medicine

## 2024-08-05 ENCOUNTER — Ambulatory Visit: Payer: Self-pay | Admitting: Obstetrics and Gynecology

## 2024-12-30 ENCOUNTER — Ambulatory Visit: Admitting: Dermatology
